# Patient Record
Sex: Male | Born: 1935 | Race: Black or African American | Hispanic: No | Marital: Married | Smoking: Current every day smoker
Health system: Southern US, Community
[De-identification: ages and names within clinical notes are randomized; demographics above are authoritative.]

## PROBLEM LIST (undated history)

## (undated) DIAGNOSIS — F0390 Unspecified dementia without behavioral disturbance: Secondary | ICD-10-CM

## (undated) DIAGNOSIS — D649 Anemia, unspecified: Secondary | ICD-10-CM

## (undated) HISTORY — PX: NO PAST SURGERIES: SHX2092

---

## 2007-11-05 ENCOUNTER — Emergency Department (HOSPITAL_COMMUNITY): Admission: EM | Admit: 2007-11-05 | Discharge: 2007-11-05 | Payer: Self-pay | Admitting: Emergency Medicine

## 2015-07-09 ENCOUNTER — Inpatient Hospital Stay (HOSPITAL_COMMUNITY)
Admission: EM | Admit: 2015-07-09 | Discharge: 2015-07-16 | DRG: 064 | Disposition: A | Discharge status: Skilled Nursing Facility | Payer: Medicare Other | Attending: Family Medicine | Admitting: Family Medicine

## 2015-07-09 ENCOUNTER — Inpatient Hospital Stay (HOSPITAL_COMMUNITY): Payer: Medicare Other

## 2015-07-09 ENCOUNTER — Emergency Department (HOSPITAL_COMMUNITY): Payer: Medicare Other

## 2015-07-09 ENCOUNTER — Encounter (HOSPITAL_COMMUNITY): Payer: Self-pay | Admitting: *Deleted

## 2015-07-09 DIAGNOSIS — G510 Bell's palsy: Secondary | ICD-10-CM | POA: Diagnosis present

## 2015-07-09 DIAGNOSIS — F0390 Unspecified dementia without behavioral disturbance: Secondary | ICD-10-CM | POA: Diagnosis present

## 2015-07-09 DIAGNOSIS — N179 Acute kidney failure, unspecified: Secondary | ICD-10-CM | POA: Diagnosis present

## 2015-07-09 DIAGNOSIS — I7389 Other specified peripheral vascular diseases: Secondary | ICD-10-CM | POA: Diagnosis present

## 2015-07-09 DIAGNOSIS — T148XXA Other injury of unspecified body region, initial encounter: Secondary | ICD-10-CM

## 2015-07-09 DIAGNOSIS — E11649 Type 2 diabetes mellitus with hypoglycemia without coma: Secondary | ICD-10-CM | POA: Diagnosis present

## 2015-07-09 DIAGNOSIS — R451 Restlessness and agitation: Secondary | ICD-10-CM | POA: Diagnosis present

## 2015-07-09 DIAGNOSIS — S0003XA Contusion of scalp, initial encounter: Secondary | ICD-10-CM | POA: Diagnosis not present

## 2015-07-09 DIAGNOSIS — I808 Phlebitis and thrombophlebitis of other sites: Secondary | ICD-10-CM | POA: Diagnosis not present

## 2015-07-09 DIAGNOSIS — I639 Cerebral infarction, unspecified: Secondary | ICD-10-CM

## 2015-07-09 DIAGNOSIS — D649 Anemia, unspecified: Secondary | ICD-10-CM | POA: Diagnosis present

## 2015-07-09 DIAGNOSIS — R471 Dysarthria and anarthria: Secondary | ICD-10-CM | POA: Diagnosis present

## 2015-07-09 DIAGNOSIS — R7989 Other specified abnormal findings of blood chemistry: Secondary | ICD-10-CM

## 2015-07-09 DIAGNOSIS — Z720 Tobacco use: Secondary | ICD-10-CM | POA: Diagnosis present

## 2015-07-09 DIAGNOSIS — F0391 Unspecified dementia with behavioral disturbance: Secondary | ICD-10-CM | POA: Diagnosis not present

## 2015-07-09 DIAGNOSIS — N189 Chronic kidney disease, unspecified: Secondary | ICD-10-CM | POA: Diagnosis present

## 2015-07-09 DIAGNOSIS — Z66 Do not resuscitate: Secondary | ICD-10-CM | POA: Diagnosis present

## 2015-07-09 DIAGNOSIS — Z681 Body mass index (BMI) 19 or less, adult: Secondary | ICD-10-CM

## 2015-07-09 DIAGNOSIS — Z23 Encounter for immunization: Secondary | ICD-10-CM

## 2015-07-09 DIAGNOSIS — E43 Unspecified severe protein-calorie malnutrition: Secondary | ICD-10-CM | POA: Insufficient documentation

## 2015-07-09 DIAGNOSIS — W1830XA Fall on same level, unspecified, initial encounter: Secondary | ICD-10-CM | POA: Diagnosis not present

## 2015-07-09 DIAGNOSIS — E162 Hypoglycemia, unspecified: Secondary | ICD-10-CM | POA: Diagnosis not present

## 2015-07-09 DIAGNOSIS — Z515 Encounter for palliative care: Secondary | ICD-10-CM | POA: Diagnosis not present

## 2015-07-09 DIAGNOSIS — E860 Dehydration: Secondary | ICD-10-CM | POA: Diagnosis present

## 2015-07-09 DIAGNOSIS — R64 Cachexia: Secondary | ICD-10-CM | POA: Diagnosis present

## 2015-07-09 DIAGNOSIS — D62 Acute posthemorrhagic anemia: Secondary | ICD-10-CM | POA: Diagnosis not present

## 2015-07-09 DIAGNOSIS — G92 Toxic encephalopathy: Secondary | ICD-10-CM | POA: Diagnosis present

## 2015-07-09 DIAGNOSIS — E785 Hyperlipidemia, unspecified: Secondary | ICD-10-CM | POA: Diagnosis present

## 2015-07-09 DIAGNOSIS — E871 Hypo-osmolality and hyponatremia: Secondary | ICD-10-CM | POA: Diagnosis not present

## 2015-07-09 DIAGNOSIS — I129 Hypertensive chronic kidney disease with stage 1 through stage 4 chronic kidney disease, or unspecified chronic kidney disease: Secondary | ICD-10-CM | POA: Diagnosis present

## 2015-07-09 DIAGNOSIS — I6789 Other cerebrovascular disease: Secondary | ICD-10-CM | POA: Diagnosis not present

## 2015-07-09 DIAGNOSIS — H510 Palsy (spasm) of conjugate gaze: Secondary | ICD-10-CM

## 2015-07-09 DIAGNOSIS — I6330 Cerebral infarction due to thrombosis of unspecified cerebral artery: Principal | ICD-10-CM | POA: Insufficient documentation

## 2015-07-09 DIAGNOSIS — R0902 Hypoxemia: Secondary | ICD-10-CM | POA: Diagnosis not present

## 2015-07-09 DIAGNOSIS — F17210 Nicotine dependence, cigarettes, uncomplicated: Secondary | ICD-10-CM | POA: Diagnosis present

## 2015-07-09 DIAGNOSIS — R410 Disorientation, unspecified: Secondary | ICD-10-CM | POA: Diagnosis not present

## 2015-07-09 DIAGNOSIS — G9340 Encephalopathy, unspecified: Secondary | ICD-10-CM | POA: Diagnosis present

## 2015-07-09 DIAGNOSIS — E1122 Type 2 diabetes mellitus with diabetic chronic kidney disease: Secondary | ICD-10-CM | POA: Diagnosis present

## 2015-07-09 DIAGNOSIS — N390 Urinary tract infection, site not specified: Secondary | ICD-10-CM | POA: Diagnosis present

## 2015-07-09 HISTORY — DX: Unspecified dementia, unspecified severity, without behavioral disturbance, psychotic disturbance, mood disturbance, and anxiety: F03.90

## 2015-07-09 HISTORY — DX: Anemia, unspecified: D64.9

## 2015-07-09 LAB — COMPREHENSIVE METABOLIC PANEL
ALT: 17 U/L (ref 17–63)
AST: 24 U/L (ref 15–41)
Albumin: 3.5 g/dL (ref 3.5–5.0)
Alkaline Phosphatase: 59 U/L (ref 38–126)
Anion gap: 10 (ref 5–15)
BILIRUBIN TOTAL: 0.8 mg/dL (ref 0.3–1.2)
BUN: 14 mg/dL (ref 6–20)
CHLORIDE: 110 mmol/L (ref 101–111)
CO2: 22 mmol/L (ref 22–32)
CREATININE: 1.71 mg/dL — AB (ref 0.61–1.24)
Calcium: 8.8 mg/dL — ABNORMAL LOW (ref 8.9–10.3)
GFR, EST AFRICAN AMERICAN: 39 mL/min — AB (ref >60)
GFR, EST NON AFRICAN AMERICAN: 34 mL/min — AB (ref >60)
Glucose, Bld: 95 mg/dL (ref 65–99)
Potassium: 4.2 mmol/L (ref 3.5–5.1)
Sodium: 142 mmol/L (ref 135–145)
TOTAL PROTEIN: 7 g/dL (ref 6.5–8.1)

## 2015-07-09 LAB — URINALYSIS, ROUTINE W REFLEX MICROSCOPIC
BILIRUBIN URINE: NEGATIVE
GLUCOSE, UA: NEGATIVE mg/dL
HGB URINE DIPSTICK: NEGATIVE
Ketones, ur: NEGATIVE mg/dL
Nitrite: POSITIVE — AB
PROTEIN: NEGATIVE mg/dL
SPECIFIC GRAVITY, URINE: 1.012 (ref 1.005–1.030)
pH: 5.5 (ref 5.0–8.0)

## 2015-07-09 LAB — FOLATE: FOLATE: 14.2 ng/mL (ref >5.9)

## 2015-07-09 LAB — IRON AND TIBC
IRON: 20 ug/dL — AB (ref 45–182)
Saturation Ratios: 8 % — ABNORMAL LOW (ref 17.9–39.5)
TIBC: 245 ug/dL — AB (ref 250–450)
UIBC: 225 ug/dL

## 2015-07-09 LAB — CBC
HCT: 33.6 % — ABNORMAL LOW (ref 39.0–52.0)
Hemoglobin: 10.9 g/dL — ABNORMAL LOW (ref 13.0–17.0)
MCH: 28.3 pg (ref 26.0–34.0)
MCHC: 32.4 g/dL (ref 30.0–36.0)
MCV: 87.3 fL (ref 78.0–100.0)
PLATELETS: 167 10*3/uL (ref 150–400)
RBC: 3.85 MIL/uL — ABNORMAL LOW (ref 4.22–5.81)
RDW: 14 % (ref 11.5–15.5)
WBC: 9.3 10*3/uL (ref 4.0–10.5)

## 2015-07-09 LAB — URINE MICROSCOPIC-ADD ON: RBC / HPF: NONE SEEN RBC/hpf (ref 0–5)

## 2015-07-09 LAB — TSH: TSH: 1.952 u[IU]/mL (ref 0.350–4.500)

## 2015-07-09 LAB — RETICULOCYTES
RBC.: 3.87 MIL/uL — ABNORMAL LOW (ref 4.22–5.81)
Retic Count, Absolute: 50.3 10*3/uL (ref 19.0–186.0)
Retic Ct Pct: 1.3 % (ref 0.4–3.1)

## 2015-07-09 LAB — CBG MONITORING, ED: GLUCOSE-CAPILLARY: 75 mg/dL (ref 65–99)

## 2015-07-09 LAB — I-STAT CG4 LACTIC ACID, ED
LACTIC ACID, VENOUS: 1.89 mmol/L (ref 0.5–2.0)
Lactic Acid, Venous: 1.48 mmol/L (ref 0.5–2.0)

## 2015-07-09 LAB — FERRITIN: FERRITIN: 503 ng/mL — AB (ref 24–336)

## 2015-07-09 LAB — VITAMIN B12: Vitamin B-12: 398 pg/mL (ref 180–914)

## 2015-07-09 MED ORDER — ACETAMINOPHEN 650 MG RE SUPP: 650.0000 mg | Freq: Four times a day (QID) | RECTAL | Status: DC | PRN

## 2015-07-09 MED ORDER — SODIUM CHLORIDE 0.9 % IV SOLN
Freq: Once | INTRAVENOUS | Status: AC
  Administered 2015-07-09: 125 mL/h via INTRAVENOUS

## 2015-07-09 MED ORDER — DEXTROSE 5 % IV SOLN
1.0000 g | INTRAVENOUS | Status: DC
  Administered 2015-07-10 – 2015-07-12 (×3): 1 g via INTRAVENOUS
  Filled 2015-07-09 (×4): qty 10

## 2015-07-09 MED ORDER — SODIUM CHLORIDE 0.9 % IV SOLN: INTRAVENOUS | Status: DC

## 2015-07-09 MED ORDER — ALBUTEROL SULFATE (2.5 MG/3ML) 0.083% IN NEBU: 2.5000 mg | INHALATION_SOLUTION | RESPIRATORY_TRACT | Status: DC | PRN

## 2015-07-09 MED ORDER — ADULT MULTIVITAMIN W/MINERALS CH
1.0000 | ORAL_TABLET | Freq: Every day | ORAL | Status: DC
  Administered 2015-07-10 – 2015-07-16 (×7): 1 via ORAL
  Filled 2015-07-09 (×8): qty 1

## 2015-07-09 MED ORDER — ENSURE ENLIVE PO LIQD
237.0000 mL | Freq: Two times a day (BID) | ORAL | Status: DC
  Administered 2015-07-10 – 2015-07-16 (×7): 237 mL via ORAL

## 2015-07-09 MED ORDER — DEXTROSE 5 % IV SOLN
1.0000 g | Freq: Once | INTRAVENOUS | Status: AC
  Administered 2015-07-09: 1 g via INTRAVENOUS
  Filled 2015-07-09: qty 10

## 2015-07-09 MED ORDER — SODIUM CHLORIDE 0.9 % IV SOLN
INTRAVENOUS | Status: DC
  Administered 2015-07-09: 22:00:00 via INTRAVENOUS

## 2015-07-09 MED ORDER — ENOXAPARIN SODIUM 30 MG/0.3ML ~~LOC~~ SOLN
30.0000 mg | SUBCUTANEOUS | Status: DC
  Administered 2015-07-09 – 2015-07-14 (×6): 30 mg via SUBCUTANEOUS
  Filled 2015-07-09 (×7): qty 0.3

## 2015-07-09 MED ORDER — ACETAMINOPHEN 325 MG PO TABS
650.0000 mg | ORAL_TABLET | Freq: Four times a day (QID) | ORAL | Status: DC | PRN
  Administered 2015-07-14: 650 mg via ORAL
  Filled 2015-07-09: qty 2

## 2015-07-09 MED ORDER — PNEUMOCOCCAL VAC POLYVALENT 25 MCG/0.5ML IJ INJ
0.5000 mL | INJECTION | INTRAMUSCULAR | Status: AC
  Administered 2015-07-10: 0.5 mL via INTRAMUSCULAR
  Filled 2015-07-09: qty 0.5

## 2015-07-09 MED ORDER — HALOPERIDOL LACTATE 5 MG/ML IJ SOLN
5.0000 mg | Freq: Once | INTRAMUSCULAR | Status: AC
  Administered 2015-07-09: 5 mg via INTRAVENOUS
  Filled 2015-07-09: qty 1

## 2015-07-09 MED ORDER — NICOTINE 14 MG/24HR TD PT24
14.0000 mg | MEDICATED_PATCH | Freq: Every day | TRANSDERMAL | Status: DC
  Administered 2015-07-09 – 2015-07-15 (×7): 14 mg via TRANSDERMAL
  Filled 2015-07-09 (×7): qty 1

## 2015-07-09 MED ORDER — TETANUS-DIPHTH-ACELL PERTUSSIS 5-2.5-18.5 LF-MCG/0.5 IM SUSP
0.5000 mL | Freq: Once | INTRAMUSCULAR | Status: AC
  Administered 2015-07-09: 0.5 mL via INTRAMUSCULAR
  Filled 2015-07-09: qty 0.5

## 2015-07-09 NOTE — Progress Notes (Signed)
Received report from [name], RN in ED 

## 2015-07-09 NOTE — ED Notes (Signed)
Pt here via [facility] for altered behavior/weakness.  Pt normally wakes up at 10 am.  At 1245 wife noted that pt was not up and about.   Went to pt room and found him still laying in bed.  She noticed a skin tear to R arm.   CBG 63 initially per [facility] that increased to 114 after apple juice.  Sats 97% RA upon arrival.  Pt altered per norm per family.  No known med hx d/t fact that pt has not seen MD since he was discharged [facility].

## 2015-07-09 NOTE — H&P (Signed)
History and Physical    Roy Cole FQH:[id number] DOB: [birth year] DOA: 07/09/2015  Referring Provider: Dr. Tammy Sours, [place] PCP: No primary care provider on file.  Outpatient Specialists:  None  Patient coming from: Home  Chief Complaint: Confusion  HPI: Roy Cole is a 82 y.o. male, married, lives with spouse, independent of activities of daily living, has not seen a physician in decades, PMH of anemia, reported dementia, tobacco abuse, presented to [facility] ED on 07/09/15 with complaints of confusion. Patient unable to provide any history. Patient's spouse and 2 sons at bedside provided history. At baseline, patient apparently has memory deficits and intermittent confusion, gradually declining over the last 1 year, independent of activities of daily living and ambulates at home without help of assistance. He was in his usual state of health when he went to bed last night. He did not wake up at his usual time which is about 10 AM. His wife then went to look for him at approximately 1pm when she found him laying in bed with some new skin tears over right forearm. A lamp had been knocked down indicating that he might have fallen. Patient appeared confused and complained of some low back pain but nothing else. EMS found CBG of 63 which increased to 114 after apple juice. No reported fever, chills, headache, earache, sore throat, dyspnea, chest pain, nausea, vomiting, diarrhea. Apparently does not drink enough liquids. Chronic intermittent smoker's cough. Urinary frequency but no dysuria reported.  ED Course: Workup revealed hemoglobin 10.9, creatinine 1.7, normal LFTs, glucose 75, urine microscopy suggestive of UTI, chest x-ray without acute findings, CT head without acute findings. CT scan cervical spine and x-ray of lumbar spine suggestive of degenerative disease. Hydrated with IV fluids and received a dose of IV Rocephin. Disconjugate gaze noted by EDP-then family indicated that this is new. No  strokelike symptoms. Hospitalist admission was requested.  Review of Systems:  All other systems reviewed and apart from HPI, are negative.  Past Medical History  Diagnosis Date  . Anemia   . Dementia     Past Surgical History  Procedure Laterality Date  . No past surgeries     Social history  reports that he has been smoking Cigarettes.  He has been smoking about 0.50 packs per day. He does not have any smokeless tobacco history on file. He reports that he does not drink alcohol or use illicit drugs.  No Known Allergies  Family History  Problem Relation Age of Onset  . Family history unknown: Yes   As per family, no significant family history. Patient unable to provide any history by himself.  Prior to Admission medications   Not on File    Physical Exam: Filed Vitals:   07/09/15 1440 07/09/15 1445 07/09/15 1515 07/09/15 1904  BP:  156/90 168/95 142/103  Temp: 99.1 F (37.3 C)     TempSrc: Oral     Resp:   28   Height: _0  (1.727 m)     Weight: 54.432 kg (120 lb)         Constitutional: Elderly male, moderately built, frail and cachectic, chronically ill-looking, restless and trying to get up from the gurney. Patient was examined with ED RN in room. Family at bedside. Eyes: PERTLA, lids and conjunctivae normal. Disconjugate gaze. Bilateral cataracts and arcus senilis. ENMT: Mucous membranes are dry. Posterior pharynx clear of any exudate or lesions. Edentulous..  Neck: normal, supple, no masses, no thyromegaly Respiratory: clear to auscultation bilaterally/poor inspiratory effort, no  wheezing, no crackles. No increased work of breathing. No accessory muscle use.  Cardiovascular: S1 & S2 heard, regular rate and rhythm, no murmurs / rubs / gallops. No extremity edema. 2+ pedal pulses. No carotid bruits.  Abdomen: No distension, no tenderness, no masses palpated. No hepatosplenomegaly. Bowel sounds normal.  Musculoskeletal: no clubbing / cyanosis. No joint deformity  upper and lower extremities. Good ROM, no contractures. Normal muscle tone.  Skin: Superficial skin tears over right scapula, right hip and a larger skin tear over right mid forearm. No other acute findings noted. Neurologic: Alert but not oriented and does not follow instructions. Disconjugate eye movements. Right eye with lateral preferential gaze and does not seem to cross midline. Sensation seem intact, DTR normal. Strength 5/5 in all 4 limbs.  Psychiatric: Unable to assess secondary to confusion.     Labs on Admission: I have personally reviewed following labs and imaging studies  CBC:  Recent Labs Lab 07/09/15 1523  WBC 9.3  HGB 10.9*  HCT 33.6*  MCV 87.3  PLT 720   Basic Metabolic Panel:  Recent Labs Lab 07/09/15 1523  NA 142  K 4.2  CL 110  CO2 22  GLUCOSE 95  BUN 14  CREATININE 1.71*  CALCIUM 8.8*   GFR: Estimated Creatinine Clearance: 22.5 mL/min (by C-G formula based on Cr of 1.71). Liver Function Tests:  Recent Labs Lab 07/09/15 1523  AST 24  ALT 17  ALKPHOS 59  BILITOT 0.8  PROT 7.0  ALBUMIN 3.5   No results for input(s): LIPASE, AMYLASE in the last 168 hours. No results for input(s): AMMONIA in the last 168 hours. Coagulation Profile: No results for input(s): INR, PROTIME in the last 168 hours. Cardiac Enzymes: No results for input(s): CKTOTAL, CKMB, CKMBINDEX, TROPONINI in the last 168 hours. BNP (last 3 results) No results for input(s): PROBNP in the last 8760 hours. HbA1C: No results for input(s): HGBA1C in the last 72 hours. CBG:  Recent Labs Lab 07/09/15 1510  GLUCAP 75   Lipid Profile: No results for input(s): CHOL, HDL, LDLCALC, TRIG, CHOLHDL, LDLDIRECT in the last 72 hours. Thyroid Function Tests: No results for input(s): TSH, T4TOTAL, FREET4, T3FREE, THYROIDAB in the last 72 hours. Anemia Panel: No results for input(s): VITAMINB12, FOLATE, FERRITIN, TIBC, IRON, RETICCTPCT in the last 72 hours. Urine analysis:    Component  Value Date/Time   COLORURINE YELLOW 07/09/2015 1527   APPEARANCEUR CLOUDY* 07/09/2015 1527   LABSPEC 1.012 07/09/2015 1527   PHURINE 5.5 07/09/2015 1527   GLUCOSEU NEGATIVE 07/09/2015 1527   HGBUR NEGATIVE 07/09/2015 [place] 07/09/2015 1527   [place] 07/09/2015 1527   PROTEINUR NEGATIVE 07/09/2015 1527   NITRITE POSITIVE* 07/09/2015 1527   LEUKOCYTESUR SMALL* 07/09/2015 1527     Radiological Exams on Admission: Dg Chest 2 View  07/09/2015  CLINICAL DATA:  Weakness and altered behavior.  Questionable fall EXAM: CHEST  2 VIEW COMPARISON:  None. FINDINGS: There is scarring in the left base. There is no edema or consolidation. Heart size and pulmonary vascularity are normal. No adenopathy. No pneumothorax. No acute fracture evident. IMPRESSION: Scarring left base. No edema or consolidation. No apparent pneumothorax. Electronically Signed   By: Lowella Grip III M.D.   On: 07/09/2015 15:56   Dg Lumbar Spine Complete  07/09/2015  CLINICAL DATA:  Pain following fall EXAM: LUMBAR SPINE - COMPLETE 4+ VIEW COMPARISON:  None. FINDINGS: Frontal, lateral, spot lumbosacral lateral, and bilateral oblique views were obtained. There are 5  non-rib-bearing lumbar type vertebral bodies. There is lumbar dextroscoliosis with rotatory component. There is anterior wedging of the L1 and L2 vertebral bodies, age uncertain. No other fractures are identified. There is no spondylolisthesis. There is moderate disc space narrowing at L2-3 and L3-4. There is facet osteoarthritic change at L3-4, L4-5, and L5-S1 bilaterally. IMPRESSION: Age uncertain anterior wedging of the L1 and L2 vertebral bodies. Scoliosis with osteoarthritic change at several levels. No spondylolisthesis. Electronically Signed   By: Lowella Grip III M.D.   On: 07/09/2015 15:58   Ct Head Wo Contrast  07/09/2015  CLINICAL DATA:  Altered mental status with fall EXAM: CT HEAD WITHOUT CONTRAST CT CERVICAL SPINE WITHOUT  CONTRAST TECHNIQUE: Multidetector CT imaging of the head and cervical spine was performed following the standard protocol without intravenous contrast. Multiplanar CT image reconstructions of the cervical spine were also generated. COMPARISON:  None. FINDINGS: CT HEAD FINDINGS There is mild generalized atrophy. There is no intracranial mass, hemorrhage, extra-axial fluid collection, or midline shift. There is patchy small vessel disease throughout the centra semiovale bilaterally. Elsewhere gray-white compartments appear normal. No acute infarct is evident. The bony calvarium appears intact. The mastoid air cells are clear. No intraorbital lesions are evident. Paranasal sinuses are clear. CT CERVICAL SPINE FINDINGS There is no acute fracture. There is 4 mm of retrolisthesis of C5 on C6. There is minimal retrolisthesis of C6 on C7. There is no other spondylolisthesis. Prevertebral soft tissues and predental space regions are normal. There is marked bony overgrowth along the anterior arch of C2 on the left, consistent with advanced osteoarthritis and possibly representative of residua from prior trauma. No acute lesion is seen in this area. There is moderate facet hypertrophy at multiple levels bilaterally. There is exit foraminal narrowing due to bony hypertrophy at C3-4 on the right, at C4-5 bilaterally, at C5-6 bilaterally, and at C6-7 on the right due to bony hypertrophy. There is bullous disease in the lung apices bilaterally. There are foci of carotid artery calcification bilaterally. IMPRESSION: CT head: Atrophy with periventricular small vessel disease. No intracranial mass, hemorrhage, or extra-axial fluid collection. No acute infarct evident. CT cervical spine: No acute fracture. Areas of spondylolisthesis at C5-6 and C6-7 are felt to be due to underlying spondylolisthesis. Question old trauma anterior aspect of C2 on the left. Multifocal arthropathy with exit foraminal narrowing at multiple levels. Foci of  carotid artery calcification bilaterally noted. Bullous disease noted in each upper lobe/apex region. Electronically Signed   By: Lowella Grip III M.D.   On: 07/09/2015 16:53   Ct Cervical Spine Wo Contrast  07/09/2015  CLINICAL DATA:  Altered mental status with fall EXAM: CT HEAD WITHOUT CONTRAST CT CERVICAL SPINE WITHOUT CONTRAST TECHNIQUE: Multidetector CT imaging of the head and cervical spine was performed following the standard protocol without intravenous contrast. Multiplanar CT image reconstructions of the cervical spine were also generated. COMPARISON:  None. FINDINGS: CT HEAD FINDINGS There is mild generalized atrophy. There is no intracranial mass, hemorrhage, extra-axial fluid collection, or midline shift. There is patchy small vessel disease throughout the centra semiovale bilaterally. Elsewhere gray-white compartments appear normal. No acute infarct is evident. The bony calvarium appears intact. The mastoid air cells are clear. No intraorbital lesions are evident. Paranasal sinuses are clear. CT CERVICAL SPINE FINDINGS There is no acute fracture. There is 4 mm of retrolisthesis of C5 on C6. There is minimal retrolisthesis of C6 on C7. There is no other spondylolisthesis. Prevertebral soft tissues and predental space regions are  normal. There is marked bony overgrowth along the anterior arch of C2 on the left, consistent with advanced osteoarthritis and possibly representative of residua from prior trauma. No acute lesion is seen in this area. There is moderate facet hypertrophy at multiple levels bilaterally. There is exit foraminal narrowing due to bony hypertrophy at C3-4 on the right, at C4-5 bilaterally, at C5-6 bilaterally, and at C6-7 on the right due to bony hypertrophy. There is bullous disease in the lung apices bilaterally. There are foci of carotid artery calcification bilaterally. IMPRESSION: CT head: Atrophy with periventricular small vessel disease. No intracranial mass,  hemorrhage, or extra-axial fluid collection. No acute infarct evident. CT cervical spine: No acute fracture. Areas of spondylolisthesis at C5-6 and C6-7 are felt to be due to underlying spondylolisthesis. Question old trauma anterior aspect of C2 on the left. Multifocal arthropathy with exit foraminal narrowing at multiple levels. Foci of carotid artery calcification bilaterally noted. Bullous disease noted in each upper lobe/apex region. Electronically Signed   By: Lowella Grip III M.D.   On: 07/09/2015 16:53    EKG: Independently reviewed. Sinus rhythm without acute findings. QTC 428 ms.  Assessment/Plan Principal Problem:   UTI (lower urinary tract infection) Active Problems:   Acute encephalopathy   Dehydration   Renal failure, acute on chronic (HCC)   Anemia   Dementia   Tobacco abuse   Hypoglycemia     UTI - Continue IV Rocephin pending urine culture results.  Dehydration - IV fluids  Acute encephalopathy - Secondary to acute medical illness (UTI, dehydration, acute kidney injury, R/O stroke) complicating underlying dementia - Treat underlying cause and monitor.   Acute on chronic kidney disease - Baseline creatinine not known. Presented with creatinine of 1.7. - Hydrated with IV fluids and follow BMP. - Check bladder scan to rule out urinary retention. - Check renal ultrasound to rule out hydronephrosis.  Anemia, chronic - Chek anemia panel. Follow CBCs.  Dementia with behavioral disturbance - As per spouse, felt to have dementia for the last year and gradually declining. - Mental status worse than baseline. Normally he is calm. - Check B12, TSH  Disconjugate gaze - Neurology consulted and recommend MRI brain with and without contrast. Left partial third no palsy versus a medial rectus palsy on the left versus partial INO from stroke, per neurology evaluation. However given renal insufficiency, discussed with nephrologist and will obtain MRI of brain without  contrast only. Check ESR and TSH.  Hypoglycemia - ? Secondary to poor oral intake. Monitor CBGs and treat as needed.  Tobacco abuse - Nicotine patch  Possible fall at home with skin tears - PT and OT evaluation.   DVT prophylaxis: Lovenox  Code Status: Full  Family Communication: Discussed with patient's spouse and 2 sons at bedside.  Disposition Plan: DC home when medically stable.  Consults called: Neurology.  Admission status: Inpatient, medical bed.    [facility] MD [facility] Pager [phone]  If 7PM-7AM, please contact night-coverage [email or web address] Password [email or web address]  07/09/2015, 7:18 PM

## 2015-07-09 NOTE — Consult Note (Addendum)
NEURO HOSPITALIST CONSULT NOTE      Reason for Consult:disconjugate gaze   History obtained from:  Family at bedside  HPI:                                                                                                                                          Roy Cole is an 82 yo male with hx of dementia who presents today post fall at home, found to have a UTI is presenting with disconjugate gaze which per family appears to be new. Has severe dementia at baseline but more altered here due to UTI.  Past Medical History  Diagnosis Date  . Anemia   . Dementia     Past Surgical History  Procedure Laterality Date  . No past surgeries      Family History  Problem Relation Age of Onset  . Family history unknown: Yes      Social History:  reports that he has been smoking Cigarettes.  He has been smoking about 0.50 packs per day. He does not have any smokeless tobacco history on file. He reports that he does not drink alcohol or use illicit drugs.  No Known Allergies  MEDICATIONS:                                                                                                                     I have reviewed the patient's current medications.   ROS:                                                                                                                                       History obtained from chart review  General ROS: negative for -  chills, fatigue, fever, night sweats, weight gain or weight loss Psychological ROS: negative for - behavioral disorder, hallucinations, memory difficulties, mood swings or suicidal ideation Ophthalmic ROS: negative for - blurry vision, double vision, eye pain or loss of vision ENT ROS: negative for - epistaxis, nasal discharge, oral lesions, sore throat, tinnitus or vertigo Allergy and Immunology ROS: negative for - hives or itchy/watery eyes Hematological and Lymphatic ROS: negative for - bleeding problems,  bruising or swollen lymph nodes Endocrine ROS: negative for - galactorrhea, hair pattern changes, polydipsia/polyuria or temperature intolerance Respiratory ROS: negative for - cough, hemoptysis, shortness of breath or wheezing Cardiovascular ROS: negative for - chest pain, dyspnea on exertion, edema or irregular heartbeat Gastrointestinal ROS: negative for - abdominal pain, diarrhea, hematemesis, nausea/vomiting or stool incontinence Genito-Urinary ROS: negative for - dysuria, hematuria, incontinence or urinary frequency/urgency Musculoskeletal ROS: negative for - joint swelling or muscular weakness Neurological ROS: as noted in HPI Dermatological ROS: negative for rash and skin lesion changes   Blood pressure 168/95, temperature 99.1 F (37.3 C), temperature source Oral, resp. rate 28, height 5' 8"  (1.727 m), weight 54.432 kg (120 lb).   Neurologic Examination:                                                                                                      HEENT-  Normocephalic, no lesions, without obvious abnormality.  Normal external eye and conjunctiva.  Normal TM's bilaterally.  Normal auditory canals and external ears. Normal external nose, mucus membranes and septum.  Normal pharynx. Cardiovascular- regular rate and rhythm, S1, S2 normal, no murmur, click, rub or gallop, pulses palpable throughout   Lungs- chest clear, no wheezing, rales, normal symmetric air entry, Heart exam - S1, S2 normal, no murmur, no gallop, rate regular Abdomen- soft, non-tender; bowel sounds normal; no masses,  no organomegaly   Neurological Examination Mental Status: Alert, Oriented x to self Follows commands Moves all extremities, 5/5 Pupils are small and equal 45m and reactive R eye has good EOMI L eye appears he cant adduct it but is able to look up and down. Appears as a partial 3rd nerve palsy. Denies any pain No sensory deficits noted      Lab Results: Basic Metabolic Panel:  Recent  Labs Lab 07/09/15 1523  NA 142  K 4.2  CL 110  CO2 22  GLUCOSE 95  BUN 14  CREATININE 1.71*  CALCIUM 8.8*    Liver Function Tests:  Recent Labs Lab 07/09/15 1523  AST 24  ALT 17  ALKPHOS 59  BILITOT 0.8  PROT 7.0  ALBUMIN 3.5   No results for input(s): LIPASE, AMYLASE in the last 168 hours. No results for input(s): AMMONIA in the last 168 hours.  CBC:  Recent Labs Lab 07/09/15 1523  WBC 9.3  HGB 10.9*  HCT 33.6*  MCV 87.3  PLT 167    Cardiac Enzymes: No results for input(s): CKTOTAL, CKMB, CKMBINDEX, TROPONINI in the last 168 hours.  Lipid Panel: No results for input(s): CHOL, TRIG, HDL, CHOLHDL, VLDL, LDLCALC in the last 168 hours.  CBG:  Recent Labs Lab 07/09/15 [place]    Microbiology: No results found for this or any previous visit.  Coagulation Studies: No results for input(s): LABPROT, INR in the last 72 hours.  Imaging: Dg Chest 2 View  07/09/2015  CLINICAL DATA:  Weakness and altered behavior.  Questionable fall EXAM: CHEST  2 VIEW COMPARISON:  None. FINDINGS: There is scarring in the left base. There is no edema or consolidation. Heart size and pulmonary vascularity are normal. No adenopathy. No pneumothorax. No acute fracture evident. IMPRESSION: Scarring left base. No edema or consolidation. No apparent pneumothorax. Electronically Signed   By: Lowella Grip III M.D.   On: 07/09/2015 15:56   Dg Lumbar Spine Complete  07/09/2015  CLINICAL DATA:  Pain following fall EXAM: LUMBAR SPINE - COMPLETE 4+ VIEW COMPARISON:  None. FINDINGS: Frontal, lateral, spot lumbosacral lateral, and bilateral oblique views were obtained. There are 5 non-rib-bearing lumbar type vertebral bodies. There is lumbar dextroscoliosis with rotatory component. There is anterior wedging of the L1 and L2 vertebral bodies, age uncertain. No other fractures are identified. There is no spondylolisthesis. There is moderate disc space narrowing at L2-3 and L3-4. There is  facet osteoarthritic change at L3-4, L4-5, and L5-S1 bilaterally. IMPRESSION: Age uncertain anterior wedging of the L1 and L2 vertebral bodies. Scoliosis with osteoarthritic change at several levels. No spondylolisthesis. Electronically Signed   By: Lowella Grip III M.D.   On: 07/09/2015 15:58   Ct Head Wo Contrast  07/09/2015  CLINICAL DATA:  Altered mental status with fall EXAM: CT HEAD WITHOUT CONTRAST CT CERVICAL SPINE WITHOUT CONTRAST TECHNIQUE: Multidetector CT imaging of the head and cervical spine was performed following the standard protocol without intravenous contrast. Multiplanar CT image reconstructions of the cervical spine were also generated. COMPARISON:  None. FINDINGS: CT HEAD FINDINGS There is mild generalized atrophy. There is no intracranial mass, hemorrhage, extra-axial fluid collection, or midline shift. There is patchy small vessel disease throughout the centra semiovale bilaterally. Elsewhere gray-white compartments appear normal. No acute infarct is evident. The bony calvarium appears intact. The mastoid air cells are clear. No intraorbital lesions are evident. Paranasal sinuses are clear. CT CERVICAL SPINE FINDINGS There is no acute fracture. There is 4 mm of retrolisthesis of C5 on C6. There is minimal retrolisthesis of C6 on C7. There is no other spondylolisthesis. Prevertebral soft tissues and predental space regions are normal. There is marked bony overgrowth along the anterior arch of C2 on the left, consistent with advanced osteoarthritis and possibly representative of residua from prior trauma. No acute lesion is seen in this area. There is moderate facet hypertrophy at multiple levels bilaterally. There is exit foraminal narrowing due to bony hypertrophy at C3-4 on the right, at C4-5 bilaterally, at C5-6 bilaterally, and at C6-7 on the right due to bony hypertrophy. There is bullous disease in the lung apices bilaterally. There are foci of carotid artery calcification  bilaterally. IMPRESSION: CT head: Atrophy with periventricular small vessel disease. No intracranial mass, hemorrhage, or extra-axial fluid collection. No acute infarct evident. CT cervical spine: No acute fracture. Areas of spondylolisthesis at C5-6 and C6-7 are felt to be due to underlying spondylolisthesis. Question old trauma anterior aspect of C2 on the left. Multifocal arthropathy with exit foraminal narrowing at multiple levels. Foci of carotid artery calcification bilaterally noted. Bullous disease noted in each upper lobe/apex region. Electronically Signed   By: Lowella Grip III M.D.   On: 07/09/2015 16:53   Ct Cervical  Spine Wo Contrast  07/09/2015  CLINICAL DATA:  Altered mental status with fall EXAM: CT HEAD WITHOUT CONTRAST CT CERVICAL SPINE WITHOUT CONTRAST TECHNIQUE: Multidetector CT imaging of the head and cervical spine was performed following the standard protocol without intravenous contrast. Multiplanar CT image reconstructions of the cervical spine were also generated. COMPARISON:  None. FINDINGS: CT HEAD FINDINGS There is mild generalized atrophy. There is no intracranial mass, hemorrhage, extra-axial fluid collection, or midline shift. There is patchy small vessel disease throughout the centra semiovale bilaterally. Elsewhere gray-white compartments appear normal. No acute infarct is evident. The bony calvarium appears intact. The mastoid air cells are clear. No intraorbital lesions are evident. Paranasal sinuses are clear. CT CERVICAL SPINE FINDINGS There is no acute fracture. There is 4 mm of retrolisthesis of C5 on C6. There is minimal retrolisthesis of C6 on C7. There is no other spondylolisthesis. Prevertebral soft tissues and predental space regions are normal. There is marked bony overgrowth along the anterior arch of C2 on the left, consistent with advanced osteoarthritis and possibly representative of residua from prior trauma. No acute lesion is seen in this area. There is  moderate facet hypertrophy at multiple levels bilaterally. There is exit foraminal narrowing due to bony hypertrophy at C3-4 on the right, at C4-5 bilaterally, at C5-6 bilaterally, and at C6-7 on the right due to bony hypertrophy. There is bullous disease in the lung apices bilaterally. There are foci of carotid artery calcification bilaterally. IMPRESSION: CT head: Atrophy with periventricular small vessel disease. No intracranial mass, hemorrhage, or extra-axial fluid collection. No acute infarct evident. CT cervical spine: No acute fracture. Areas of spondylolisthesis at C5-6 and C6-7 are felt to be due to underlying spondylolisthesis. Question old trauma anterior aspect of C2 on the left. Multifocal arthropathy with exit foraminal narrowing at multiple levels. Foci of carotid artery calcification bilaterally noted. Bullous disease noted in each upper lobe/apex region. Electronically Signed   By: Lowella Grip III M.D.   On: 07/09/2015 16:53         Assessment/Plan:  82 yo male with hx of dementia who presents today post fall at home, found to have a UTI is presenting with disconjugate gaze which per family appears to be new. Has severe dementia at baseline but more altered here due to UTI.  Disconjugate gaze: On exam only has L partial 3rd nerve palsy vs a medial rectus muscle palsy on the L vs partial INO from stroke. Denies any pain in that eye  - recommend MRI brain with and without contrast -HA1c as DM can cause cranial nerve palsies  - Check ESR as giant cell aritits can potenially cause this - Thyroid panel - Myasthenia gravis is also on the differential but less likely due to his age and no other symptoms

## 2015-07-09 NOTE — ED Provider Notes (Signed)
CSN: [id number]     Arrival date & time 07/09/15  1423 History   First MD Initiated Contact with Patient 07/09/15 1505     Chief Complaint  Patient presents with  . Weakness     HPI Roy Cole is a 82 y.o. M wo sPMH who pw for possible altered mental state, which was described as him sleeping in today more than normal, awoke at 1PM rather than  1030.  No witnessed fall but family noted a new skin tear to right forearm.  Otherwise not acting any differently.No neck pain.  Family says he probabyl has dementia, not oriented to place or time at abseline.  +LBP worse with movement.  FSBG  Per EMS than improved to 114 with juice.  No known f/c/n/c/abd pain/ dysuria/polyuria/diarrhea.  No CP or SOB.  Has not yet tried to walk.   Has not seen doctor in about 30-40 years  History reviewed. No pertinent past medical history. History reviewed. No pertinent past surgical history. No family history on file. Social History  Substance Use Topics  . Smoking status: Current Every Day Smoker -- 0.50 packs/day    Types: Cigarettes  . Smokeless tobacco: None  . Alcohol Use: No    Review of Systems  Constitutional: Positive for fatigue. Negative for fever, chills, diaphoresis, activity change and appetite change.  HENT: Negative for congestion.   Eyes: Negative for photophobia, pain, redness and visual disturbance.  Respiratory: Negative for cough, chest tightness and shortness of breath.   Cardiovascular: Negative for chest pain and leg swelling.  Gastrointestinal: Negative for nausea, vomiting, abdominal pain, diarrhea, constipation and abdominal distention.  Endocrine: Negative for polyuria.  Genitourinary: Negative for dysuria and frequency.  Musculoskeletal: Positive for back pain. Negative for myalgias, joint swelling, arthralgias, neck pain and neck stiffness.  Skin: Positive for wound.  Neurological: Positive for weakness (general). Negative for seizures, syncope, speech difficulty,  light-headedness, numbness and headaches.  All other systems reviewed and are negative.     Allergies  Review of patient's allergies indicates no known allergies.  Home Medications   Prior to Admission medications   Not on File   BP 168/95 mmHg  Temp(Src) 99.1 F (37.3 C) (Oral)  Resp 28  Ht  (1.727 m)  Wt 54.432 kg  BMI 18.25 kg/m2 Physical Exam  Constitutional: He appears well-developed and well-nourished. No distress.  Thin elderly  HENT:  Head: Normocephalic and atraumatic.  Nose: Nose normal.  Eyes: Conjunctivae are normal.  Left eye does not cross midline on rightward gaze. Pupils 2mm equal and sluggish. No conj pallor  Neck: Normal range of motion. Neck supple. No tracheal deviation present.  Cardiovascular: Normal rate, regular rhythm and normal heart sounds.   No murmur heard. Pulmonary/Chest: Effort normal and breath sounds normal. No respiratory distress. He has no rales.  Abdominal: Soft. Bowel sounds are normal. He exhibits no distension and no mass. There is no tenderness.  Genitourinary:  Large, firm, nontender, nonboggy prostate  Musculoskeletal: Normal range of motion. He exhibits no edema.  Neurological: He is alert.  Oriented to person not place or time.  Skin: Skin is warm and dry. No rash noted.  Psychiatric: He has a normal mood and affect.  Nursing note and vitals reviewed.   ED Course  Procedures (including critical care time) Labs Review Labs Reviewed  CBC - Abnormal; Notable for the following:    RBC 3.85 (*)    Hemoglobin 10.9 (*)    HCT 33.6 (*)  All other components within normal limits  URINALYSIS, ROUTINE W REFLEX MICROSCOPIC (NOT [facility]) - Abnormal; Notable for the following:    APPearance CLOUDY (*)    Nitrite POSITIVE (*)    Leukocytes, UA SMALL (*)    All other components within normal limits  COMPREHENSIVE METABOLIC PANEL - Abnormal; Notable for the following:    Creatinine, Ser 1.71 (*)    Calcium 8.8 (*)    GFR  calc non Af Amer 34 (*)    GFR calc Af Amer 39 (*)    All other components within normal limits  URINE MICROSCOPIC-ADD ON - Abnormal; Notable for the following:    Squamous Epithelial / LPF 0-5 (*)    Bacteria, UA MANY (*)    All other components within normal limits  URINE CULTURE  CULTURE, BLOOD (ROUTINE X 2)  CULTURE, BLOOD (ROUTINE X 2)  CBG MONITORING, ED  I-STAT CG4 LACTIC ACID, ED    Imaging Review Dg Chest 2 View  07/09/2015  CLINICAL DATA:  Weakness and altered behavior.  Questionable fall EXAM: CHEST  2 VIEW COMPARISON:  None. FINDINGS: There is scarring in the left base. There is no edema or consolidation. Heart size and pulmonary vascularity are normal. No adenopathy. No pneumothorax. No acute fracture evident. IMPRESSION: Scarring left base. No edema or consolidation. No apparent pneumothorax. Electronically Signed   By: Bretta BangWilliam  Woodruff III M.D.   On: 07/09/2015 15:56   Dg Lumbar Spine Complete  07/09/2015  CLINICAL DATA:  Pain following fall EXAM: LUMBAR SPINE - COMPLETE 4+ VIEW COMPARISON:  None. FINDINGS: Frontal, lateral, spot lumbosacral lateral, and bilateral oblique views were obtained. There are 5 non-rib-bearing lumbar type vertebral bodies. There is lumbar dextroscoliosis with rotatory component. There is anterior wedging of the L1 and L2 vertebral bodies, age uncertain. No other fractures are identified. There is no spondylolisthesis. There is moderate disc space narrowing at L2-3 and L3-4. There is facet osteoarthritic change at L3-4, L4-5, and L5-S1 bilaterally. IMPRESSION: Age uncertain anterior wedging of the L1 and L2 vertebral bodies. Scoliosis with osteoarthritic change at several levels. No spondylolisthesis. Electronically Signed   By: Bretta BangWilliam  Woodruff III M.D.   On: 07/09/2015 15:58   Ct Head Wo Contrast  07/09/2015  CLINICAL DATA:  Altered mental status with fall EXAM: CT HEAD WITHOUT CONTRAST CT CERVICAL SPINE WITHOUT CONTRAST TECHNIQUE: Multidetector CT  imaging of the head and cervical spine was performed following the standard protocol without intravenous contrast. Multiplanar CT image reconstructions of the cervical spine were also generated. COMPARISON:  None. FINDINGS: CT HEAD FINDINGS There is mild generalized atrophy. There is no intracranial mass, hemorrhage, extra-axial fluid collection, or midline shift. There is patchy small vessel disease throughout the centra semiovale bilaterally. Elsewhere gray-white compartments appear normal. No acute infarct is evident. The bony calvarium appears intact. The mastoid air cells are clear. No intraorbital lesions are evident. Paranasal sinuses are clear. CT CERVICAL SPINE FINDINGS There is no acute fracture. There is 4 mm of retrolisthesis of C5 on C6. There is minimal retrolisthesis of C6 on C7. There is no other spondylolisthesis. Prevertebral soft tissues and predental space regions are normal. There is marked bony overgrowth along the anterior arch of C2 on the left, consistent with advanced osteoarthritis and possibly representative of residua from prior trauma. No acute lesion is seen in this area. There is moderate facet hypertrophy at multiple levels bilaterally. There is exit foraminal narrowing due to bony hypertrophy at C3-4 on the right, at C4-5  bilaterally, at C5-6 bilaterally, and at C6-7 on the right due to bony hypertrophy. There is bullous disease in the lung apices bilaterally. There are foci of carotid artery calcification bilaterally. IMPRESSION: CT head: Atrophy with periventricular small vessel disease. No intracranial mass, hemorrhage, or extra-axial fluid collection. No acute infarct evident. CT cervical spine: No acute fracture. Areas of spondylolisthesis at C5-6 and C6-7 are felt to be due to underlying spondylolisthesis. Question old trauma anterior aspect of C2 on the left. Multifocal arthropathy with exit foraminal narrowing at multiple levels. Foci of carotid artery calcification  bilaterally noted. Bullous disease noted in each upper lobe/apex region. Electronically Signed   By: Bretta Bang III M.D.   On: 07/09/2015 16:53   Ct Cervical Spine Wo Contrast  07/09/2015  CLINICAL DATA:  Altered mental status with fall EXAM: CT HEAD WITHOUT CONTRAST CT CERVICAL SPINE WITHOUT CONTRAST TECHNIQUE: Multidetector CT imaging of the head and cervical spine was performed following the standard protocol without intravenous contrast. Multiplanar CT image reconstructions of the cervical spine were also generated. COMPARISON:  None. FINDINGS: CT HEAD FINDINGS There is mild generalized atrophy. There is no intracranial mass, hemorrhage, extra-axial fluid collection, or midline shift. There is patchy small vessel disease throughout the centra semiovale bilaterally. Elsewhere gray-white compartments appear normal. No acute infarct is evident. The bony calvarium appears intact. The mastoid air cells are clear. No intraorbital lesions are evident. Paranasal sinuses are clear. CT CERVICAL SPINE FINDINGS There is no acute fracture. There is 4 mm of retrolisthesis of C5 on C6. There is minimal retrolisthesis of C6 on C7. There is no other spondylolisthesis. Prevertebral soft tissues and predental space regions are normal. There is marked bony overgrowth along the anterior arch of C2 on the left, consistent with advanced osteoarthritis and possibly representative of residua from prior trauma. No acute lesion is seen in this area. There is moderate facet hypertrophy at multiple levels bilaterally. There is exit foraminal narrowing due to bony hypertrophy at C3-4 on the right, at C4-5 bilaterally, at C5-6 bilaterally, and at C6-7 on the right due to bony hypertrophy. There is bullous disease in the lung apices bilaterally. There are foci of carotid artery calcification bilaterally. IMPRESSION: CT head: Atrophy with periventricular small vessel disease. No intracranial mass, hemorrhage, or extra-axial fluid  collection. No acute infarct evident. CT cervical spine: No acute fracture. Areas of spondylolisthesis at C5-6 and C6-7 are felt to be due to underlying spondylolisthesis. Question old trauma anterior aspect of C2 on the left. Multifocal arthropathy with exit foraminal narrowing at multiple levels. Foci of carotid artery calcification bilaterally noted. Bullous disease noted in each upper lobe/apex region. Electronically Signed   By: Bretta Bang III M.D.   On: 07/09/2015 16:53   I have personally reviewed and evaluated these images and lab results as part of my medical decision-making.   EKG Interpretation   Date/Time:  Friday Jul 09 2015 14:42:35 EDT Ventricular Rate:  78 PR Interval:  136 QRS Duration: 58 QT Interval:  376 QTC Calculation: 428 R Axis:   80 Text Interpretation:  Normal sinus rhythm Cannot rule out Septal infarct ,  age undetermined Abnormal ECG No previous tracing Confirmed by CRENSHAW   MD, BRIAN ([id number]) on 07/09/2015 3:20:33 PM       MDM   Final diagnoses:  UTI (lower urinary tract infection)  Elevated serum creatinine    Possible fall.  HTN, unknown BP prior.  Could have head bleed. There is a EOM nerve palsy CN6.  Mild fever to 100.4.  No HPI sx of preceding infx sx.  Will check labs and CXR and head imaging.   CT head wo ICH/trauma Considered CVA given unknown hx of CN6 palsy, outside stroke window given wakening with sx.  No other neuro deficit on exam.  CXR neg for acute patholgy, no PNA.   UA with +nitrites, and TNTC bacteria, will need to treat; complicated UTI. No rectal pain.   AKI? No prior sCr to compare to, will start gentle fluids Rectal exam not cw prostatitis.    Pt does not have vital sign abnormalities on my exam to indicate sepsis, HR controlled, not tachypneic, normotensive, no leuks.  Treat for UTI, cultures obtained.    Will admit   Sofie Rower, MD 07/09/15 [place]  Gerhard Munch, MD 07/09/15 2127

## 2015-07-10 ENCOUNTER — Inpatient Hospital Stay (HOSPITAL_COMMUNITY): Payer: Medicare Other

## 2015-07-10 DIAGNOSIS — D638 Anemia in other chronic diseases classified elsewhere: Secondary | ICD-10-CM

## 2015-07-10 DIAGNOSIS — I6330 Cerebral infarction due to thrombosis of unspecified cerebral artery: Secondary | ICD-10-CM | POA: Diagnosis not present

## 2015-07-10 DIAGNOSIS — N390 Urinary tract infection, site not specified: Secondary | ICD-10-CM

## 2015-07-10 LAB — LIPID PANEL
CHOLESTEROL: 129 mg/dL (ref 0–200)
HDL: 54 mg/dL (ref >40)
LDL Cholesterol: 59 mg/dL (ref 0–99)
Total CHOL/HDL Ratio: 2.4 RATIO
Triglycerides: 80 mg/dL (ref <150)
VLDL: 16 mg/dL (ref 0–40)

## 2015-07-10 LAB — GLUCOSE, CAPILLARY
GLUCOSE-CAPILLARY: 48 mg/dL — AB (ref 65–99)
GLUCOSE-CAPILLARY: 57 mg/dL — AB (ref 65–99)
GLUCOSE-CAPILLARY: 69 mg/dL (ref 65–99)
GLUCOSE-CAPILLARY: 76 mg/dL (ref 65–99)
Glucose-Capillary: 138 mg/dL — ABNORMAL HIGH (ref 65–99)
Glucose-Capillary: 100 mg/dL — ABNORMAL HIGH (ref 65–99)
Glucose-Capillary: 87 mg/dL (ref 65–99)
Glucose-Capillary: 90 mg/dL (ref 65–99)
Glucose-Capillary: 98 mg/dL (ref 65–99)

## 2015-07-10 LAB — BASIC METABOLIC PANEL
Anion gap: 11 (ref 5–15)
BUN: 16 mg/dL (ref 6–20)
CALCIUM: 8.5 mg/dL — AB (ref 8.9–10.3)
CHLORIDE: 109 mmol/L (ref 101–111)
CO2: 20 mmol/L — ABNORMAL LOW (ref 22–32)
CREATININE: 1.53 mg/dL — AB (ref 0.61–1.24)
GFR, EST AFRICAN AMERICAN: 45 mL/min — AB (ref >60)
GFR, EST NON AFRICAN AMERICAN: 39 mL/min — AB (ref >60)
Glucose, Bld: 108 mg/dL — ABNORMAL HIGH (ref 65–99)
Potassium: 4.1 mmol/L (ref 3.5–5.1)
SODIUM: 140 mmol/L (ref 135–145)

## 2015-07-10 LAB — CBC
HCT: 32.7 % — ABNORMAL LOW (ref 39.0–52.0)
Hemoglobin: 10.3 g/dL — ABNORMAL LOW (ref 13.0–17.0)
MCH: 27.5 pg (ref 26.0–34.0)
MCHC: 31.5 g/dL (ref 30.0–36.0)
MCV: 87.2 fL (ref 78.0–100.0)
PLATELETS: 170 10*3/uL (ref 150–400)
RBC: 3.75 MIL/uL — AB (ref 4.22–5.81)
RDW: 14 % (ref 11.5–15.5)
WBC: 9.4 10*3/uL (ref 4.0–10.5)

## 2015-07-10 LAB — SEDIMENTATION RATE: Sed Rate: 35 mm/hr — ABNORMAL HIGH (ref 0–16)

## 2015-07-10 MED ORDER — LORAZEPAM 2 MG/ML IJ SOLN
0.5000 mg | Freq: Four times a day (QID) | INTRAMUSCULAR | Status: DC | PRN
  Administered 2015-07-10: 0.5 mg via INTRAVENOUS
  Filled 2015-07-10 (×2): qty 1

## 2015-07-10 MED ORDER — DEXTROSE-NACL 5-0.45 % IV SOLN
INTRAVENOUS | Status: DC
Start: 2015-07-10 — End: 2015-07-11
  Administered 2015-07-10 – 2015-07-11 (×4): via INTRAVENOUS

## 2015-07-10 MED ORDER — GLUCAGON HCL RDNA (DIAGNOSTIC) 1 MG IJ SOLR
INTRAMUSCULAR | Status: AC
  Filled 2015-07-10: qty 1

## 2015-07-10 MED ORDER — GLUCAGON HCL RDNA (DIAGNOSTIC) 1 MG IJ SOLR
INTRAMUSCULAR | Status: AC
  Administered 2015-07-10: 1 mg
  Filled 2015-07-10: qty 1

## 2015-07-10 MED ORDER — DEXTROSE 50 % IV SOLN
INTRAVENOUS | Status: AC
  Administered 2015-07-10: 25 mL
  Administered 2015-07-11: 25 mL via INTRAVENOUS
  Filled 2015-07-10: qty 50

## 2015-07-10 MED ORDER — ASPIRIN 325 MG PO TABS
325.0000 mg | ORAL_TABLET | Freq: Every day | ORAL | Status: DC
  Administered 2015-07-10 – 2015-07-16 (×7): 325 mg via ORAL
  Filled 2015-07-10 (×7): qty 1

## 2015-07-10 NOTE — Progress Notes (Signed)
Subjective: Continues to be confused, MRI shows a small infarct.   He has had confusion before when he went to [place].   Exam: Filed Vitals:   07/10/15 0527 07/10/15 1524  BP: 176/94 146/88  Pulse: 78 73  Temp: 98.7 F (37.1 C) 98.9 F (37.2 C)  Resp: 18 16   Gen: In bed, NAD Resp: non-labored breathing, no acute distress Abd: soft, nt  Neuro: MS: Awake, alert, does not know where he is or what year it is. He has difficulty following complex commands.  JX:BJYNWCN:PERRL, he has a partial left third nerve palsy.  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 I suspect his confusion is multifactorial including change in environement(this alone has been enough to cause him to be confused in the past), new stroke, and UTI.   His midbrain stroke I suspect is due to small vessel disease.   Recommendations: 1) LDL 59, OK 2) echo 3) asa 4) PT, OT 5) stroke team to follow.   Ritta SlotMcNeill Delania Ferg, MD [facility] [phone]  If 7pm- 7am, please page neurology on call as listed in AMION.

## 2015-07-10 NOTE — Evaluation (Addendum)
Physical Therapy Evaluation Patient Details Name: Roy Cole MRN: [id number] DOB: Feb 26, 1924 Today's Date: 07/10/2015   History of Present Illness  Shabazz Mckey is a 82 y.o. male, lives with spouse, independent of activities of daily living, PMH of anemia, reported dementia, tobacco abuse, presented to [facility] ED on 07/09/15 with complaints of confusion.  Patient's spouse and 2 sons at bedside provided history. At baseline, patient apparently has memory deficits and intermittent confusion, gradually declining over the last 1 year, independent of activities of daily living and ambulates at home without help of assistance. MRI infarct in the left paramedian midbrain    Clinical Impression  Pt admitted with above diagnosis. Pt currently with functional limitations due to the deficits listed below (see PT Problem List). Unclear level of family support on discharge. Currently requires mod-max assist of 2 to ambulate. Pt will benefit from skilled PT to increase their independence and safety with mobility to allow discharge to the venue listed below.       Follow Up Recommendations Other (comment) (TBA as caregiver support becomes clear)    Equipment Recommendations  None recommended by PT    Recommendations for Other Services OT consult     Precautions / Restrictions Precautions Precautions: Fall;Other (comment) Precaution Comments: dementia      Mobility  Bed Mobility Overal bed mobility: Needs Assistance Bed Mobility: Supine to Sit     Supine to sit: Min assist     General bed mobility comments: followed vc to sit at EOB  Transfers Overall transfer level: Needs assistance Equipment used: None Transfers: Sit to/from Stand Sit to Stand: Mod assist         General transfer comment: unsteady and confusion; assist for balance  Ambulation/Gait Ambulation/Gait assistance: Mod assist;+2 physical assistance;+2 safety/equipment Ambulation Distance (Feet): 10 Feet (toileting, 10  ft) Assistive device: 1 person hand held assist Gait Pattern/deviations: Step-through pattern;Decreased stride length;Decreased weight shift to left;Scissoring;Staggering right;Drifts right/left;Narrow base of support Gait velocity: slow Gait velocity interpretation: Below normal speed for age/gender General Gait Details: very unsteady and needing max directional cues to get from bed to bathroom; stood statically from toilet x 4 minutes with mod assist due to imbalance and restless (with RN cleaning bottom)  Stairs            Wheelchair Mobility    Modified Rankin (Stroke Patients Only) Modified Rankin (Stroke Patients Only) Pre-Morbid Rankin Score: No symptoms Modified Rankin: Moderately severe disability     Balance Overall balance assessment: Needs assistance Sitting-balance support: No upper extremity supported;Feet supported Sitting balance-Leahy Scale: Fair     Standing balance support: Single extremity supported Standing balance-Leahy Scale: Poor                               Pertinent Vitals/Pain Pain Assessment: Faces Faces Pain Scale: No hurt    Home Living Family/patient expects to be discharged to:: Unsure Living Arrangements: Spouse/significant other                    Prior Function Level of Independence: Independent         Comments: independent with ADLs and mobility; + dementia     Hand Dominance        Extremity/Trunk Assessment   Upper Extremity Assessment: Defer to OT evaluation;Difficult to assess due to impaired cognition           Lower Extremity Assessment: Difficult to assess due to impaired cognition;RLE  deficits/detail RLE Deficits / Details: unable to manually muscle test; during ambulation pt with difficulty placing RLE (tending to adduct, scissor)    Cervical / Trunk Assessment: Normal  Communication   Communication: No difficulties  Cognition Arousal/Alertness: Awake/alert Behavior During  Therapy: Restless Overall Cognitive Status: Impaired/Different from baseline Area of Impairment: Orientation;Attention;Following commands;Safety/judgement;Awareness Orientation Level: Place;Time;Situation Current Attention Level: Sustained Memory: Decreased short-term memory Following Commands: Follows one step commands inconsistently Safety/Judgement: Decreased awareness of safety;Decreased awareness of deficits     General Comments: restless in bed; incontinent of BM and unaware    General Comments General comments (skin integrity, edema, etc.): Pt with dysconjugate gaze and applied gauze over Lt eye to attempt to correct for likely double vision.     Exercises        Assessment/Plan    PT Assessment Patient needs continued PT services  PT Diagnosis Hemiplegia dominant side;Altered mental status;Difficulty walking   PT Problem List Decreased balance;Decreased mobility;Decreased cognition;Decreased knowledge of use of DME;Decreased safety awareness;Decreased knowledge of precautions;Other (comment) (dysconjugate gaze with double vision?)  PT Treatment Interventions DME instruction;Gait training;Functional mobility training;Therapeutic activities;Balance training;Neuromuscular re-education;Cognitive remediation;Patient/family education   PT Goals (Current goals can be found in the Care Plan section) Acute Rehab PT Goals Patient Stated Goal: unable PT Goal Formulation: Patient unable to participate in goal setting Time For Goal Achievement: 2015-11-04 Potential to Achieve Goals: Fair    Frequency Min 4X/week   Barriers to discharge        Co-evaluation               End of Session Equipment Utilized During Treatment: Gait belt Activity Tolerance: Patient tolerated treatment well Patient left: in bed;with nursing/sitter in room;Other (comment) (lab tech ) Nurse Communication: Mobility status;Other (comment) (use of patch lt eye to decr double vision/?improve balance)          Time: [id number] PT Time Calculation (min) (ACUTE ONLY): 22 min   Charges:   PT Evaluation $PT Eval Low Complexity: 1 Procedure     PT G Codes:        Haynes Giannotti 07/10/2015, 12:31 PM Pager [phone]

## 2015-07-10 NOTE — Progress Notes (Signed)
PT Cancellation Note  Patient Details Name: Rica Recordsnderson Quayle MRN: [id number] DOB: 13-Aug-1923   Cancelled Treatment:    Reason Eval/Treat Not Completed: Other (comment)  Patient sleeping soundly (per chart agitated overnight). Usually sleeps until 10 AM per chart. MRI also still pending. Will attempt to see later today.  Khaylee Mcevoy 07/10/2015, 9:04 AM  Pager [phone]

## 2015-07-10 NOTE — Progress Notes (Addendum)
PROGRESS NOTE  Roy Cole  WUJ:[id number] DOB: 28-Nov-1923  DOA: 07/09/2015 PCP: No primary care provider on file.  Outpatient Specialists:  None  Brief Narrative:  82 y.o. male, married, lives with spouse, independent of activities of daily living, has not seen a physician in decades, PMH of anemia, reported dementia, tobacco abuse, presented to [facility] ED on 07/09/15 with complaints of confusion, possible fall with small skin tears, hypoglycemia. Evaluation in ED revealed acute on chronic kidney disease, UTI, disconjugate gaze/rule out stroke. Admitted to telemetry. Neurology consulted.   Assessment & Plan:   Principal Problem:   UTI (lower urinary tract infection) Active Problems:   Acute encephalopathy   Dehydration   Renal failure, acute on chronic (HCC)   Anemia   Dementia   Tobacco abuse   Hypoglycemia   UTI - Continue IV Rocephin pending urine culture results.  Dehydration - IV fluids. Improved. Continue additional 24 hours of gentle IV fluids.  Acute encephalopathy - Secondary to acute medical illness (UTI, dehydration, acute kidney injury, stroke) complicating underlying dementia - Treat underlying cause and monitor.  - Appears slightly better. Not as restless as he was in ED. Oriented to self.  Acute on chronic kidney disease - Baseline creatinine not known. Presented with creatinine of 1.7. - Hydrated with IV fluids and follow BMP. - Renal ultrasound: No right-sided hydronephrosis. Left kidney not visualized. - Creatinine has improved to 1.4.  Anemia, chronic, possibly chronic disease versus iron deficiency - Anemia panel results as below. Hemoglobin stable.  Dementia with behavioral disturbance - As per spouse, felt to have dementia for the last year and gradually declining. - Mental status worse than baseline. Normally he is calm. - B-12: 398. TSH 1.952.  Acute left brain stroke/Disconjugate gaze - Neurology was consulted and recommended MRI brain - MRI:  Acute, nonhemorrhagic infarct in the left paramedian midbrain. - Complete stroke workup: 2-D echo, carotid Dopplers, lipids. Follow A1c. - Patient may not be able to cooperate for MRA brain. Neurology to advise if needs to do MRA under sedation versus TCD's. - Start aspirin. Bedside RN stroke eval - Place on telemetry.  Hypoglycemia - ? Secondary to poor oral intake. IV fluids changed to dextrose containing fluids. Encourage oral intake. Monitor  Tobacco abuse - Nicotine patch  Possible fall at home with skin tears - PT and OT evaluation.  Hypertension - Allow for permissive hypertension given recent acute stroke.  Right kidney superior pole lesion, seen on renal ultrasound - Elective outpatient workup as deemed necessary.   DVT prophylaxis: Lovenox Code Status: Full Family Communication: Discussed with spouse in detail, updated care and answered questions. Disposition Plan: Probably will need SNF when medically stable.   Consultants:   Neurology  Procedures:   None  Antimicrobials:   IV Rocephin 5/5 >    Subjective: Patient less confused and less restless. Oriented to self. Does not follow instructions.  Objective:  Filed Vitals:   07/09/15 1904 07/09/15 1920 07/09/15 2004 07/10/15 0527  BP: 142/103  184/157 176/94  Pulse:   79 78  Temp:  99.7 F (37.6 C) 98 F (36.7 C) 98.7 F (37.1 C)  TempSrc:      Resp:   18 18  Height:      Weight:      SpO2:   76% 45%    Intake/Output Summary (Last 24 hours) at 07/10/15 1155 Last data filed at 07/10/15 0900  Gross per 24 hour  Intake    755 ml  Output  0 ml  Net    755 ml   Filed Weights   07/09/15 1440  Weight: 54.432 kg (120 lb)    Examination:  General exam: Moderately built, frail, cachectic, elderly male, chronically ill looking, lying comfortably supine in bed. Appears calm and comfortable  Respiratory system: Clear to auscultation. Respiratory effort normal. Cardiovascular system: S1 & S2  heard, RRR.Marland Kitchen. No JVD, murmurs, rubs, gallops or clicks. No pedal edema. Gastrointestinal system: Abdomen is nondistended, soft and nontender. No organomegaly or masses felt. Normal bowel sounds heard. Central nervous system: Alert and oriented only to self .  Disconjugate eye movements-seems slightly better compared to admission. Extremities: Symmetric 5 x 5 power. Skin: Superficial skin tears over right scapula, right hip and a larger skin tear over right mid forearm. No other acute findings noted. Psychiatry:  Unable to assess due to confusion.     Data Reviewed: I have personally reviewed following labs and imaging studies  CBC:  Recent Labs Lab 07/09/15 1523 07/10/15 0335  WBC 9.3 9.4  HGB 10.9* 10.3*  HCT 33.6* 32.7*  MCV 87.3 87.2  PLT 167 170   Basic Metabolic Panel:  Recent Labs Lab 07/09/15 1523 07/10/15 0335  NA 142 140  K 4.2 4.1  CL 110 109  CO2 22 20*  GLUCOSE 95 108*  BUN 14 16  CREATININE 1.71* 1.53*  CALCIUM 8.8* 8.5*   GFR: Estimated Creatinine Clearance: 25.2 mL/min (by C-G formula based on Cr of 1.53). Liver Function Tests:  Recent Labs Lab 07/09/15 1523  AST 24  ALT 17  ALKPHOS 59  BILITOT 0.8  PROT 7.0  ALBUMIN 3.5   No results for input(s): LIPASE, AMYLASE in the last 168 hours. No results for input(s): AMMONIA in the last 168 hours. Coagulation Profile: No results for input(s): INR, PROTIME in the last 168 hours. Cardiac Enzymes: No results for input(s): CKTOTAL, CKMB, CKMBINDEX, TROPONINI in the last 168 hours. BNP (last 3 results) No results for input(s): PROBNP in the last 8760 hours. HbA1C: No results for input(s): HGBA1C in the last 72 hours. CBG:  Recent Labs Lab 07/09/15 1510 07/09/15 2351 07/10/15 0519 07/10/15 0612 07/10/15 0851  GLUCAP 75 76 57* 138* 100*   Lipid Profile: No results for input(s): CHOL, HDL, LDLCALC, TRIG, CHOLHDL, LDLDIRECT in the last 72 hours. Thyroid Function Tests:  Recent Labs   07/09/15 2228  TSH 1.952   Anemia Panel:  Recent Labs  07/09/15 2228  VITAMINB12 398  FOLATE 14.2  FERRITIN 503*  TIBC 245*  IRON 20*  RETICCTPCT 1.3   Urine analysis:    Component Value Date/Time   COLORURINE YELLOW 07/09/2015 1527   APPEARANCEUR CLOUDY* 07/09/2015 1527   LABSPEC 1.012 07/09/2015 1527   PHURINE 5.5 07/09/2015 1527   GLUCOSEU NEGATIVE 07/09/2015 1527   HGBUR NEGATIVE 07/09/2015 1527   BILIRUBINUR NEGATIVE 07/09/2015 1527   KETONESUR NEGATIVE 07/09/2015 1527   PROTEINUR NEGATIVE 07/09/2015 1527   NITRITE POSITIVE* 07/09/2015 1527   LEUKOCYTESUR SMALL* 07/09/2015 1527   Sepsis Labs: @LABRCNTIP (procalcitonin:4,lacticidven:4)  )No results found for this or any previous visit (from the past 240 hour(s)).       Radiology Studies: Dg Chest 2 View  07/09/2015  CLINICAL DATA:  Weakness and altered behavior.  Questionable fall EXAM: CHEST  2 VIEW COMPARISON:  None. FINDINGS: There is scarring in the left base. There is no edema or consolidation. Heart size and pulmonary vascularity are normal. No adenopathy. No pneumothorax. No acute fracture evident. IMPRESSION: Scarring left  base. No edema or consolidation. No apparent pneumothorax. Electronically Signed   By: Bretta Bang III M.D.   On: 07/09/2015 15:56   Dg Lumbar Spine Complete  07/09/2015  CLINICAL DATA:  Pain following fall EXAM: LUMBAR SPINE - COMPLETE 4+ VIEW COMPARISON:  None. FINDINGS: Frontal, lateral, spot lumbosacral lateral, and bilateral oblique views were obtained. There are 5 non-rib-bearing lumbar type vertebral bodies. There is lumbar dextroscoliosis with rotatory component. There is anterior wedging of the L1 and L2 vertebral bodies, age uncertain. No other fractures are identified. There is no spondylolisthesis. There is moderate disc space narrowing at L2-3 and L3-4. There is facet osteoarthritic change at L3-4, L4-5, and L5-S1 bilaterally. IMPRESSION: Age uncertain anterior wedging of the  L1 and L2 vertebral bodies. Scoliosis with osteoarthritic change at several levels. No spondylolisthesis. Electronically Signed   By: Bretta Bang III M.D.   On: 07/09/2015 15:58   Ct Head Wo Contrast  07/09/2015  CLINICAL DATA:  Altered mental status with fall EXAM: CT HEAD WITHOUT CONTRAST CT CERVICAL SPINE WITHOUT CONTRAST TECHNIQUE: Multidetector CT imaging of the head and cervical spine was performed following the standard protocol without intravenous contrast. Multiplanar CT image reconstructions of the cervical spine were also generated. COMPARISON:  None. FINDINGS: CT HEAD FINDINGS There is mild generalized atrophy. There is no intracranial mass, hemorrhage, extra-axial fluid collection, or midline shift. There is patchy small vessel disease throughout the centra semiovale bilaterally. Elsewhere gray-white compartments appear normal. No acute infarct is evident. The bony calvarium appears intact. The mastoid air cells are clear. No intraorbital lesions are evident. Paranasal sinuses are clear. CT CERVICAL SPINE FINDINGS There is no acute fracture. There is 4 mm of retrolisthesis of C5 on C6. There is minimal retrolisthesis of C6 on C7. There is no other spondylolisthesis. Prevertebral soft tissues and predental space regions are normal. There is marked bony overgrowth along the anterior arch of C2 on the left, consistent with advanced osteoarthritis and possibly representative of residua from prior trauma. No acute lesion is seen in this area. There is moderate facet hypertrophy at multiple levels bilaterally. There is exit foraminal narrowing due to bony hypertrophy at C3-4 on the right, at C4-5 bilaterally, at C5-6 bilaterally, and at C6-7 on the right due to bony hypertrophy. There is bullous disease in the lung apices bilaterally. There are foci of carotid artery calcification bilaterally. IMPRESSION: CT head: Atrophy with periventricular small vessel disease. No intracranial mass, hemorrhage, or  extra-axial fluid collection. No acute infarct evident. CT cervical spine: No acute fracture. Areas of spondylolisthesis at C5-6 and C6-7 are felt to be due to underlying spondylolisthesis. Question old trauma anterior aspect of C2 on the left. Multifocal arthropathy with exit foraminal narrowing at multiple levels. Foci of carotid artery calcification bilaterally noted. Bullous disease noted in each upper lobe/apex region. Electronically Signed   By: Bretta Bang III M.D.   On: 07/09/2015 16:53   Ct Cervical Spine Wo Contrast  07/09/2015  CLINICAL DATA:  Altered mental status with fall EXAM: CT HEAD WITHOUT CONTRAST CT CERVICAL SPINE WITHOUT CONTRAST TECHNIQUE: Multidetector CT imaging of the head and cervical spine was performed following the standard protocol without intravenous contrast. Multiplanar CT image reconstructions of the cervical spine were also generated. COMPARISON:  None. FINDINGS: CT HEAD FINDINGS There is mild generalized atrophy. There is no intracranial mass, hemorrhage, extra-axial fluid collection, or midline shift. There is patchy small vessel disease throughout the centra semiovale bilaterally. Elsewhere gray-white compartments appear normal. No acute  infarct is evident. The bony calvarium appears intact. The mastoid air cells are clear. No intraorbital lesions are evident. Paranasal sinuses are clear. CT CERVICAL SPINE FINDINGS There is no acute fracture. There is 4 mm of retrolisthesis of C5 on C6. There is minimal retrolisthesis of C6 on C7. There is no other spondylolisthesis. Prevertebral soft tissues and predental space regions are normal. There is marked bony overgrowth along the anterior arch of C2 on the left, consistent with advanced osteoarthritis and possibly representative of residua from prior trauma. No acute lesion is seen in this area. There is moderate facet hypertrophy at multiple levels bilaterally. There is exit foraminal narrowing due to bony hypertrophy at C3-4  on the right, at C4-5 bilaterally, at C5-6 bilaterally, and at C6-7 on the right due to bony hypertrophy. There is bullous disease in the lung apices bilaterally. There are foci of carotid artery calcification bilaterally. IMPRESSION: CT head: Atrophy with periventricular small vessel disease. No intracranial mass, hemorrhage, or extra-axial fluid collection. No acute infarct evident. CT cervical spine: No acute fracture. Areas of spondylolisthesis at C5-6 and C6-7 are felt to be due to underlying spondylolisthesis. Question old trauma anterior aspect of C2 on the left. Multifocal arthropathy with exit foraminal narrowing at multiple levels. Foci of carotid artery calcification bilaterally noted. Bullous disease noted in each upper lobe/apex region. Electronically Signed   By: Bretta Bang III M.D.   On: 07/09/2015 16:53   Mr Brain Wo Contrast  07/10/2015  CLINICAL DATA:  Gaze palsy EXAM: MRI HEAD WITHOUT CONTRAST TECHNIQUE: Multiplanar, multiecho pulse sequences of the brain and surrounding structures were obtained without intravenous contrast. COMPARISON:  Head CT from yesterday FINDINGS: Motion degraded study with late acquired sequences nondiagnostic. Calvarium and upper cervical spine: No focal marrow signal abnormality. Orbits: Negative. Sinuses and Mastoids: Clear. Brain: Linear restricted diffusion left of midline in the midbrain extending from the interpeduncular fossa to cerebral aqua duct. No superimposed hemorrhage. No acute infarct elsewhere. No evidence of major vessel occlusion. There is normal flow related signal loss in the basilar and visible branches. Generalized atrophy. Age congruent small-vessel ischemic gliosis in the periventricular white matter. IMPRESSION: 1. Acute, nonhemorrhagic infarct in the left paramedian midbrain. 2. Generalized atrophy. 3. Motion degraded study with multiple nondiagnostic sequences. Electronically Signed   By: Marnee Spring M.D.   On: 07/10/2015 11:14    US Renal  07/09/2015  CLINICAL DATA:  Patient with acute renal failure. EXAM: RENAL / URINARY TRACT ULTRASOUND COMPLETE COMPARISON:  None. FINDINGS: Right Kidney: Length: 9.2 cm. Normal renal cortical thickness and echogenicity. No hydronephrosis. Within the superior pole of the right kidney there is a 3.0 x 2.3 x 2.7 cm simple cyst. Additionally within the superior pole there there is an adjacent 2.1 x 1.6 x 2.4 cm hypoechoic lesion. Left Kidney: Not visualized. Bladder: Distended. IMPRESSION: No right-sided hydronephrosis. The left kidney is not able to be visualized. Indeterminate hypoechoic lesion superior pole right kidney. In the nonacute setting, recommend correlation with pre and post contrast-enhanced CT or MRI for definitive characterization and to exclude the possibility of solid mass. Electronically Signed   By: Annia Belt M.D.   On: 07/09/2015 20:44        Scheduled Meds: . cefTRIAXone (ROCEPHIN)  IV  1 g Intravenous Q24H  . enoxaparin (LOVENOX) injection  30 mg Subcutaneous Q24H  . feeding supplement (ENSURE ENLIVE)  237 mL Oral BID BM  . multivitamin with minerals  1 tablet Oral Daily  . nicotine  14 mg Transdermal Q2000   Continuous Infusions: . dextrose 5 % and 0.45% NaCl 100 mL/hr at 07/10/15 0938     LOS: 1 day    Time spent: 40 minutes.    [facility], MD [facility] Pager [phone]  If 7PM-7AM, please contact night-coverage [email or web address] Password [email or web address] 07/10/2015, 11:55 AM

## 2015-07-10 NOTE — Progress Notes (Signed)
Initial Nutrition Assessment  DOCUMENTATION CODES:  Underweight, Severe malnutrition in context of chronic illness   Pt meets criteria for SEVERE MALNUTRITION in the context of Chronic illness as evidenced by Severe muscle/fat loss.  INTERVENTION:  Downgrade to soft diet as family member believe this would be of benefit.   Ensure Enlive po BID, each supplement provides 350 kcal and 20 grams of protein  Magic cup BID with meals, each supplement provides 290 kcal and 9 grams of protein  MVI with minerals  NUTRITION DIAGNOSIS:  Inadequate oral intake related to chronic illness (dementia) as evidenced by severe muscle/fat depletion  GOAL:  Patient will meet greater than or equal to 90% of their needs  MONITOR:  PO intake, Supplement acceptance, Labs  REASON FOR ASSESSMENT:  Malnutrition Screening Tool    ASSESSMENT:  82 y/o male. has not seen a physician in decades, PMH of anemia, reported dementia, tobacco abuse, [facility]15 with complaints of confusion/ams. Worked up for UTI, AoC CKD and nonhemorrhagic stroke  Spoke with two sons at bedside. They report that pt has had very minimal intake for "about 1 year". They state it is largely related to the progression of his dementia. They say patient has an appetite, but when he goes to eat he will only take a couple bites. Poor cognition has severely impacted his nutrition, sons stating "it is like he forgot how to eat". Other problems include dizziness and constipation which both could be due to or associated with poor hydration/poor po intake. No n/v or diarrhea.  Pt has false teeth, though these are not with him. Sons report patient does best with soft items such as mashed potatoes or items that can be eaten with a spoon. Will downgrade to soft diet.   Pt did not take any vitamin/mineral supplements nor did he drink any nutritional supplements. RD reccommended high kcal/pro beverage on D/C to hlp maintain weight. They  reveal that pt does not like chocolate, potentially he may like strawberry or vanilla. RD also listed high kcal/pro foods such as PB, whole milk, cheese, eggs. Sons say patient likes all of these items  Pt is very apt to try to get out of bed and attempted to spring out of bed towards end of conversation.   Family states pt lives with spouse who is care giver.   Pt's UBW is 140-150 though it is unclear the last time he weighed this amount.   NFPE: Physical assessment reveals severe muscle/fat wasting.   Labs reviewed: Anemic, Lipid panel WDL   Recent Labs Lab 07/09/15 1523 07/10/15 0335  NA 142 140  K 4.2 4.1  CL 110 109  CO2 22 20*  BUN 14 16  CREATININE 1.71* 1.53*  CALCIUM 8.8* 8.5*  GLUCOSE 95 108*   Diet Order:  DIET SOFT Room service appropriate?: Yes; Fluid consistency:: Thin  Skin: Dry w/ scattered abrasions  Last BM:  Unknown  Height:  Ht Readings from Last 1 Encounters:  07/09/15 5\' 8"  (1.727 m)   Weight:  Wt Readings from Last 1 Encounters:  07/09/15 120 lb (54.432 kg)  No wt hx  Ideal Body Weight:  70 kg  BMI:  Body mass index is 18.25 kg/(m^2).  Estimated Nutritional Needs:  Kcal:  1750-1950 (32-36 kcal/kg bw) Protein:  60-70 g Pro (1.1-1.3 g/kg bw) Fluid:  1.8-2 liters  EDUCATION NEEDS:  No education needs identified at this time  Christophe LouisNathan Franks RD, LDN Clinical Nutrition Pager: [phone] 07/10/2015 3:46  PM

## 2015-07-10 NOTE — Progress Notes (Signed)
MRI brain w/o cm done. Limited due to motion sent what pt could tolerate th rough to Radiologist. If further imaging is ordered please consider sedation ; a MRA for example will not be possible with pt moving this much.

## 2015-07-10 NOTE — Progress Notes (Addendum)
Blood sugar at 16:48--69 Pt wife in process of giving him some coffee and he has strawberry ensure.  Will re-check in 15 min.  Re-check--46. Gave GlucaGen 1 mg. Will re-check in 15 min.

## 2015-07-10 NOTE — Progress Notes (Addendum)
Hypoglycemic Event  CBG: 57  Treatment: D5 25ml  Symptoms: restless/irritable  Follow-up CBG: Time: 6:10 CBG Result: 135  Possible Reasons for Event: Inadequate meal intake  Comments/MD notified: MD notified  [name], Roy Cole

## 2015-07-11 ENCOUNTER — Inpatient Hospital Stay (HOSPITAL_COMMUNITY): Payer: Medicare Other

## 2015-07-11 DIAGNOSIS — I6789 Other cerebrovascular disease: Secondary | ICD-10-CM

## 2015-07-11 DIAGNOSIS — E43 Unspecified severe protein-calorie malnutrition: Secondary | ICD-10-CM | POA: Insufficient documentation

## 2015-07-11 DIAGNOSIS — I6330 Cerebral infarction due to thrombosis of unspecified cerebral artery: Secondary | ICD-10-CM | POA: Insufficient documentation

## 2015-07-11 DIAGNOSIS — I639 Cerebral infarction, unspecified: Secondary | ICD-10-CM

## 2015-07-11 LAB — BRAIN NATRIURETIC PEPTIDE: B Natriuretic Peptide: 232.7 pg/mL — ABNORMAL HIGH (ref 0.0–100.0)

## 2015-07-11 LAB — BASIC METABOLIC PANEL
ANION GAP: 10 (ref 5–15)
ANION GAP: 9 (ref 5–15)
Anion gap: 12 (ref 5–15)
BUN: 15 mg/dL (ref 6–20)
BUN: 13 mg/dL (ref 6–20)
BUN: 13 mg/dL (ref 6–20)
CALCIUM: 8.3 mg/dL — AB (ref 8.9–10.3)
CALCIUM: 8.4 mg/dL — AB (ref 8.9–10.3)
CHLORIDE: 105 mmol/L (ref 101–111)
CHLORIDE: 101 mmol/L (ref 101–111)
CO2: 20 mmol/L — ABNORMAL LOW (ref 22–32)
CO2: 22 mmol/L (ref 22–32)
CO2: 23 mmol/L (ref 22–32)
CREATININE: 1.52 mg/dL — AB (ref 0.61–1.24)
Calcium: 8.1 mg/dL — ABNORMAL LOW (ref 8.9–10.3)
Chloride: 103 mmol/L (ref 101–111)
Creatinine, Ser: 1.5 mg/dL — ABNORMAL HIGH (ref 0.61–1.24)
Creatinine, Ser: 1.46 mg/dL — ABNORMAL HIGH (ref 0.61–1.24)
GFR calc Af Amer: 47 mL/min — ABNORMAL LOW (ref >60)
GFR calc non Af Amer: 39 mL/min — ABNORMAL LOW (ref >60)
GFR calc non Af Amer: 40 mL/min — ABNORMAL LOW (ref >60)
GFR, EST AFRICAN AMERICAN: 45 mL/min — AB (ref >60)
GFR, EST AFRICAN AMERICAN: 46 mL/min — AB (ref >60)
GFR, EST NON AFRICAN AMERICAN: 41 mL/min — AB (ref >60)
GLUCOSE: 87 mg/dL (ref 65–99)
GLUCOSE: 72 mg/dL (ref 65–99)
Glucose, Bld: 135 mg/dL — ABNORMAL HIGH (ref 65–99)
POTASSIUM: 3.7 mmol/L (ref 3.5–5.1)
POTASSIUM: 3.4 mmol/L — AB (ref 3.5–5.1)
Potassium: 4 mmol/L (ref 3.5–5.1)
SODIUM: 137 mmol/L (ref 135–145)
Sodium: 135 mmol/L (ref 135–145)
Sodium: 133 mmol/L — ABNORMAL LOW (ref 135–145)

## 2015-07-11 LAB — BLOOD GAS, ARTERIAL
Acid-base deficit: 3 mmol/L — ABNORMAL HIGH (ref 0.0–2.0)
BICARBONATE: 20.4 meq/L (ref 20.0–24.0)
DRAWN BY: 345601
O2 Content: 3 L/min
O2 SAT: 98.3 %
Patient temperature: 98.6
TCO2: 21.3 mmol/L (ref 0–100)
pCO2 arterial: 29.7 mmHg — ABNORMAL LOW (ref 35.0–45.0)
pH, Arterial: 7.451 — ABNORMAL HIGH (ref 7.350–7.450)
pO2, Arterial: 110 mmHg — ABNORMAL HIGH (ref 80.0–100.0)

## 2015-07-11 LAB — GLUCOSE, CAPILLARY
GLUCOSE-CAPILLARY: 48 mg/dL — AB (ref 65–99)
GLUCOSE-CAPILLARY: 61 mg/dL — AB (ref 65–99)
GLUCOSE-CAPILLARY: 139 mg/dL — AB (ref 65–99)
GLUCOSE-CAPILLARY: 65 mg/dL (ref 65–99)
Glucose-Capillary: 127 mg/dL — ABNORMAL HIGH (ref 65–99)
Glucose-Capillary: 91 mg/dL (ref 65–99)
Glucose-Capillary: 61 mg/dL — ABNORMAL LOW (ref 65–99)
Glucose-Capillary: 89 mg/dL (ref 65–99)
Glucose-Capillary: 89 mg/dL (ref 65–99)

## 2015-07-11 LAB — CBC
HEMATOCRIT: 30.8 % — AB (ref 39.0–52.0)
Hemoglobin: 10.1 g/dL — ABNORMAL LOW (ref 13.0–17.0)
MCH: 27.9 pg (ref 26.0–34.0)
MCHC: 32.8 g/dL (ref 30.0–36.0)
MCV: 85.1 fL (ref 78.0–100.0)
PLATELETS: 156 10*3/uL (ref 150–400)
RBC: 3.62 MIL/uL — ABNORMAL LOW (ref 4.22–5.81)
RDW: 13.8 % (ref 11.5–15.5)
WBC: 8.4 10*3/uL (ref 4.0–10.5)

## 2015-07-11 LAB — ECHOCARDIOGRAM COMPLETE
HEIGHTINCHES: 68 in
WEIGHTICAEL: 1920 [oz_av]

## 2015-07-11 LAB — URINE CULTURE: Special Requests: NORMAL

## 2015-07-11 LAB — TROPONIN I: TROPONIN I: 0.24 ng/mL — AB (ref <0.031)

## 2015-07-11 MED ORDER — DEXTROSE 50 % IV SOLN
INTRAVENOUS | Status: AC
  Filled 2015-07-11: qty 50

## 2015-07-11 MED ORDER — DEXTROSE 50 % IV SOLN
25.0000 mL | Freq: Once | INTRAVENOUS | Status: AC
  Administered 2015-07-11: 25 mL via INTRAVENOUS

## 2015-07-11 MED ORDER — DEXTROSE 50 % IV SOLN
INTRAVENOUS | Status: AC
  Administered 2015-07-11: 25 mL
  Filled 2015-07-11: qty 50

## 2015-07-11 MED ORDER — DEXTROSE 10 % IV SOLN
INTRAVENOUS | Status: AC
  Administered 2015-07-11 – 2015-07-12 (×2): via INTRAVENOUS

## 2015-07-11 MED ORDER — HALOPERIDOL LACTATE 5 MG/ML IJ SOLN
1.0000 mg | Freq: Two times a day (BID) | INTRAMUSCULAR | Status: DC | PRN
  Administered 2015-07-12 – 2015-07-14 (×3): 1 mg via INTRAVENOUS
  Filled 2015-07-11 (×3): qty 1

## 2015-07-11 MED ORDER — DEXTROSE 50 % IV SOLN
INTRAVENOUS | Status: AC
  Administered 2015-07-11: 21:00:00
  Filled 2015-07-11: qty 50

## 2015-07-11 NOTE — Progress Notes (Signed)
STROKE TEAM PROGRESS NOTE   HISTORY OF PRESENT ILLNESS Roy Cole is an 82 yo male with hx of dementia who presents today post fall at home, found to have a UTI is presenting with disconjugate gaze which per family appears to be new. Has severe dementia at baseline but more altered here due to UTI. The family noted some bruising and believes the patient may have fallen.  TPA was not administered secondary to unknown time of onset and minimal deficits.   SUBJECTIVE (INTERVAL HISTORY) The patient's wife was at the bedside. The patient was minimally responsive. He was unable to answer questions and speech was unintelligible. He did not follow commands.I obtained detailed history from patient`s wife. He has mild dementia at baseline and needs supervision but is mostly independent for most ADLs   OBJECTIVE Temp:  [98.2 F (36.8 C)-98.9 F (37.2 C)] 98.4 F (36.9 C) (05/07 1440) Pulse Rate:  [66-73] 70 (05/07 1440) Cardiac Rhythm:  [-] Normal sinus rhythm (05/07 1025) Resp:  [16-18] 16 (05/07 1440) BP: (136-159)/(68-90) 159/89 mmHg (05/07 1440) SpO2:  [94 %-100 %] 100 % (05/07 1440)  CBC:   Recent Labs Lab 07/09/15 1523 07/10/15 0335  WBC 9.3 9.4  HGB 10.9* 10.3*  HCT 33.6* 32.7*  MCV 87.3 87.2  PLT 167 170    Basic Metabolic Panel:   Recent Labs Lab 07/10/15 0335 07/11/15 0608  NA 140 137  K 4.1 4.0  CL 109 105  CO2 20* 20*  GLUCOSE 108* 135*  BUN 16 15  CREATININE 1.53* 1.52*  CALCIUM 8.5* 8.3*    Lipid Panel:     Component Value Date/Time   CHOL 129 07/10/2015 1213   TRIG 80 07/10/2015 1213   HDL 54 07/10/2015 1213   CHOLHDL 2.4 07/10/2015 1213   VLDL 16 07/10/2015 1213   LDLCALC 59 07/10/2015 1213   HgbA1c: No results found for: HGBA1C Urine Drug Screen: No results found for: LABOPIA, COCAINSCRNUR, LABBENZ, AMPHETMU, THCU, LABBARB    IMAGING  Dg Chest 2 View 07/09/2015   Scarring left base. No edema or consolidation. No apparent pneumothorax.      Dg Lumbar Spine Complete 07/09/2015   Age uncertain anterior wedging of the L1 and L2 vertebral bodies. Scoliosis with osteoarthritic change at several levels. No spondylolisthesis.     Ct Head Wo Contrast 07/09/2015   Atrophy with periventricular small vessel disease. No intracranial mass, hemorrhage, or extra-axial fluid collection. No acute infarct evident.    Ct Cervical Spine Wo Contrast 07/09/2015   No acute fracture. Areas of spondylolisthesis at C5-6 and C6-7 are felt to be due to underlying spondylolisthesis. Question old trauma anterior aspect of C2 on the left. Multifocal arthropathy with exit foraminal narrowing at multiple levels.   Foci of carotid artery calcification bilaterally noted. Bullous disease noted in each upper lobe/apex region.     Mr Brain Wo Contrast 07/10/2015   1. Acute, nonhemorrhagic infarct in the left paramedian midbrain.  2. Generalized atrophy.  3. Motion degraded study with multiple nondiagnostic sequences.     US Renal 07/09/2015   No right-sided hydronephrosis.  The left kidney is not able to be visualized.  Indeterminate hypoechoic lesion superior pole right kidney.  In the nonacute setting, recommend correlation with pre and post contrast-enhanced CT or MRI for definitive characterization and to exclude the possibility of solid mass.     PHYSICAL EXAM Frail elderly african [place] male not in distress. . Afebrile. Head is nontraumatic. Neck is supple without bruit.  Cardiac exam no murmur or gallop. Lungs are clear to auscultation. Distal pulses are well felt.  Neurological Exam :  Stuporose. Eyes closed arouses with some difficulty. Severely dysarthric and cannot be easily understood. Follows only midline occasional commands.pupils 3 mm reactive. Not following eye movement commands. Left gaze preference. Mild right lower face weakness. Tongue midline. Moves all 4 limbs purposefully to pain . No focal weakness. Withdraws to pain  equally. Plantars downgoing  ASSESSMENT/PLAN Mr. Roy Cole is a 82 y.o. male with history of severe dementia, ongoing tobacco use, and anemia presenting with disconjugate gaze. He did not receive IV t-PA due to unknown time of onset and minimal deficits.  Stroke:  Dominant infarct secondary to small vessel disease.  Resultant disconjugate gaze  MRI  Acute, nonhemorrhagic infarct in the left paramedian midbrain.   MRA  not performed  Carotid Doppler - 1-39% internal carotid artery stenosis bilaterally. Vertebral arteries are patent with antegrade flow.  2D Echo - EF 55-60%. No cardiac source of emboli identified.  LDL - 59  HgbA1c pending  VTE prophylaxis - Lovenox DIET - DYS 1 Room service appropriate?: Yes; Fluid consistency:: Thin  No antithrombotic prior to admission, now on aspirin 325 mg daily  Ongoing aggressive stroke risk factor management  Therapy recommendations: Pending  Disposition: Pending  Hypertension  Stable  Permissive hypertension (OK if < 220/120) but gradually normalize in 5-7 days  Hyperlipidemia  Home meds: No lipid lowering medications prior to admission  LDL 59, goal < 70   Other Stroke Risk Factors  Advanced age  Cigarette smoker, will be advised to stop smoking     Other Active Problems  UTI - day #2 IV Rocephin  Anemia  Renal insufficiency  Possible mass on renal ultrasound  Hospital day # 2  Delton Seeavid Rinehuls PA-C [facility] Pager [phone] 07/11/2015, 3:40 PM I have personally examined this patient, reviewed notes, independently viewed imaging studies, participated in medical decision making and plan of care. I have made any additions or clarifications directly to the above note. Agree with note above. He presented with  severe dysarthria   due to  Brainstem infarct  and remains at risk for neurological worsening, recurrent stroke/TIA and needs ongoing stroke evaluation and aggressive risk factor  modification.continue aspirin rectally till able to swallow safely. Long d/w wife at bedside and answered questions. She requested no invasive tests.Greater than 50% time during this 25 minute visit was spent on counselling and coordination of care about stroke risk and prevention.   Delia HeadyPramod Jimmie Dattilio, MD Medical Director [facility] Pager: [phone] 07/11/2015 3:45 PM     To contact Stroke Continuity provider, please refer to [email or web address]. After hours, contact General Neurology

## 2015-07-11 NOTE — Progress Notes (Signed)
*  PRELIMINARY RESULTS* Vascular Ultrasound Carotid Duplex (Doppler) has been completed. There is evidence of 1-39% internal carotid artery stenosis bilaterally. Vertebral arteries are patent with antegrade flow.  07/11/2015 9:51 AM Gertie FeyMichelle Addley Ballinger, RVT, RDCS, RDMS

## 2015-07-11 NOTE — Evaluation (Signed)
Clinical/Bedside Swallow Evaluation Patient Details  Name: Roy Cole MRN: [id number] Date of Birth: [birth year]  Today's Date: 07/11/2015 Time: SLP Start Time (ACUTE ONLY): 0836 SLP Stop Time (ACUTE ONLY): 0849 SLP Time Calculation (min) (ACUTE ONLY): 13 min  Past Medical History:  Past Medical History  Diagnosis Date  . Anemia   . Dementia    Past Surgical History:  Past Surgical History  Procedure Laterality Date  . No past surgeries     HPI:  82 yo M with midbrain stroke. I suspect his confusion is multifactorial including change in environement(this alone has been enough to cause him to be confused in the past), new stroke, and UTI.    Assessment / Plan / Recommendation Clinical Impression  Pt referred for clinical assessment of swalow function following midbrain stroke in a gentleman whose cognitive picture is also compromised by UTI and underlying dementia. Family sleeping in the room, however they did not rouse during assessment. Per dietician note, pt was eating very soft items such as mashed potatoes at home with very minimal intake. This date despite upright positioning pt was poorly able to participate with swallow assessment. Limitations appear to be related to cognitive impairment. For example, with thins pt unable to demonstrate labial seal on the cup and consistently blew into the straw rather than sucking. He required pippetting of liquid into his mouth. Pt closed his mouth with puree and demo'd oral holding with limited amount in the mouth- requiring liquid wash to clear. Pharyngeal phase appears somewhat delayed and discoordinated. Pt is certainly at risk for aspiration given cognitive status. Recommend: Dys 1 (puree) with thins and 1:1 assist to reinforce aspiration precautions. Will follow to determine if pt appropriate for upgrade or possibly an objective measure of swallow function. Discussed POC with RN who verbalized agreement.    Aspiration Risk  Moderate  aspiration risk    Diet Recommendation Dysphagia 1 (Puree);Thin liquid   Liquid Administration via:  (small sips via whatever means pt will accept liquids) Medication Administration: Crushed with puree Supervision: Full supervision/cueing for compensatory strategies Compensations: Small sips/bites;Follow solids with liquid Postural Changes: Seated upright at 90 degrees;Remain upright for at least 30 minutes after po intake    Other  Recommendations Oral Care Recommendations: Oral care BID   Follow up Recommendations  Skilled Nursing facility;24 hour supervision/assistance;Home health SLP (pending general POC)    Frequency and Duration min 2x/week  2 weeks       Prognosis Prognosis for Safe Diet Advancement: Fair Barriers to Reach Goals: Cognitive deficits;Behavior;Severity of deficits      Swallow Study   General Date of Onset: 07/09/15 HPI: 82 yo M with midbrain stroke. I suspect his confusion is multifactorial including change in environement(this alone has been enough to cause him to be confused in the past), new stroke, and UTI.  Type of Study: Bedside Swallow Evaluation Previous Swallow Assessment: none known Diet Prior to this Study: Dysphagia 3 (soft);Thin liquids Temperature Spikes Noted: No Respiratory Status: Room air History of Recent Intubation: No Behavior/Cognition: Lethargic/Drowsy;Doesn't follow directions Oral Cavity Assessment: Within Functional Limits Oral Care Completed by SLP: No Oral Cavity - Dentition: Edentulous Self-Feeding Abilities: Total assist Patient Positioning: Upright in bed Baseline Vocal Quality: Normal Volitional Cough: Cognitively unable to elicit Volitional Swallow: Unable to elicit    Oral/Motor/Sensory Function Overall Oral Motor/Sensory Function:  (Difficult to assess)   Ice Chips Ice chips: Within functional limits Presentation: Spoon   Thin Liquid Thin Liquid: Impaired Presentation: Straw Oral Phase Impairments:  (  Unable to  suck, only blew into straw) Pharyngeal  Phase Impairments: Multiple swallows;Suspected delayed Swallow (audible swallow)    Nectar Thick Nectar Thick Liquid: Not tested   Honey Thick Honey Thick Liquid: Not tested   Puree Puree: Impaired Presentation: Spoon Oral Phase Functional Implications: Oral holding   Solid   GO   Solid: Not tested        Rocky Crafts MA, CCC-SLP Pager [phone] 07/11/2015,9:11 AM

## 2015-07-11 NOTE — Progress Notes (Addendum)
PROGRESS NOTE  Roy Cole  WUJ:[id number] DOB: 1923/05/11  DOA: 07/09/2015 PCP: No primary care provider on file.  Outpatient Specialists:  None  Brief Narrative:  82 y.o. male, married, lives with spouse, independent of activities of daily living, has not seen a physician in decades, PMH of anemia, reported dementia, tobacco abuse, presented to [facility] ED on 07/09/15 with complaints of confusion, possible fall with small skin tears, hypoglycemia. Evaluation in ED revealed acute on chronic kidney disease, UTI, disconjugate gaze/rule out stroke. Admitted to telemetry. Neurology consulted.   Assessment & Plan:   Principal Problem:   UTI (lower urinary tract infection) Active Problems:   Acute encephalopathy   Dehydration   Renal failure, acute on chronic (HCC)   Anemia   Dementia   Tobacco abuse   Hypoglycemia   Protein-calorie malnutrition, severe   Cerebral thrombosis with cerebral infarction   UTI - Continue IV Rocephin pending urine culture results. Blood cultures 2: Negative to date.  Dehydration - IV fluids. Improved.   Acute encephalopathy - Secondary to acute medical illness (UTI, dehydration, acute kidney injury, stroke) complicating underlying dementia - Treat underlying cause and monitor.  - Ongoing intermittent acute agitation. Received a dose of Ativan overnight. - ? Persistent mental status changes secondary to degree of irreversible injury/prolonged hypoglycemia.  Acute on chronic kidney disease - Baseline creatinine not known. Presented with creatinine of 1.7. - Hydrated with IV fluids and follow BMP. - Renal ultrasound: No right-sided hydronephrosis. Left kidney not visualized. - Creatinine has plateaued in the 1.5 range-probably his baseline.  Anemia, chronic, possibly chronic disease versus iron deficiency - Anemia panel results as below. Hemoglobin stable.  Dementia with behavioral disturbance - As per spouse, felt to have dementia for the last year and  gradually declining. - Mental status worse than baseline. Normally he is calm. - B-12: 398. TSH 1.952. - Periodic agitation. Use Haldol when necessary rather than benzodiazepines. Monitor. - Discussed at length with patient's spouse at bedside on 5/7. Palliative care consulted for goals of care.  Acute left brain stroke/Disconjugate gaze - Neurology was consulted and recommended MRI brain - MRI: Acute, nonhemorrhagic infarct in the left paramedian midbrain. - Carotid Dopplers: 1-39 percent ICA stenosis bilaterally. Vertebral arteries are patent with antegrade flow. - LDL: 59. - Hemoglobin A1c: Pending. - 2-D echo: LVEF 55-60 percent and grade 1 diastolic dysfunction. - Complete stroke workup: 2-D echo, carotid Dopplers, lipids. Follow A1c. - Patient may not be able to cooperate for MRA brain. Neurology to advise if needs to do MRA under sedation versus TCD's. - Start aspirin. Started on dysphagia 1 diet and thin liquids per speech therapy recommendations. - Sinus rhythm on telemetry.  Hypoglycemia, recurrent - ? Secondary to poor oral intake.Encourage oral intake. Monitor - During next episode of low CBG, will check simultaneous blood glucose to make sure this is accurate. - Ongoing poor oral intake. Change IV fluids to D10.? Related to renal insufficiency.  Tobacco abuse - Nicotine patch  Possible fall at home with skin tears - PT and OT evaluation.  Hypertension - Allow for permissive hypertension given recent acute stroke.  Right kidney superior pole lesion, seen on renal ultrasound - Elective outpatient workup as deemed necessary.  SEVERE MALNUTRITION in the context of Chronic illness as evidenced by Severe muscle/fat loss -Management per dietitian  DVT prophylaxis: Lovenox Code Status: Full Family Communication: Discussed with spouse in detail, updated care and answered questions. Disposition Plan: Probably will need SNF when medically stable. Palliative care consulted  for goals of care.   Consultants:   Neurology  Palliative care team  Procedures:   None  Antimicrobials:   IV Rocephin 5/5 >    Subjective: Received a dose of Ativan overnight for agitation. Somnolent this morning but arousable-not coherent and does not follow instructions  Objective:  Filed Vitals:   07/10/15 0527 07/10/15 1524 07/10/15 2316 07/11/15 0625  BP: 176/94 146/88 136/90 136/68  Pulse: 78 73 66 70  Temp: 98.7 F (37.1 C) 98.9 F (37.2 C) 98.2 F (36.8 C) 98.2 F (36.8 C)  TempSrc:  Oral Oral Oral  Resp: Height:      Weight:      SpO2: 45% 100% 94% 94%    Intake/Output Summary (Last 24 hours) at 07/11/15 1355 Last data filed at 07/11/15 0056  Gross per 24 hour  Intake 643.33 ml  Output      0 ml  Net 643.33 ml   Filed Weights   07/09/15 1440  Weight: 54.432 kg (120 lb)    Examination:  General exam: Moderately built, frail, cachectic, elderly male, chronically ill looking, lying comfortably supine in bed. Appears calm and comfortable  Respiratory system: Clear to auscultation/Poor inspiratory effort.  Cardiovascular system: S1 & S2 heard, RRR.Marland Kitchen No JVD, murmurs, rubs, gallops or clicks. No pedal edema. Telemetry: Sinus rhythm. Gastrointestinal system: Abdomen is nondistended, soft and nontender. No organomegaly or masses felt. Normal bowel sounds heard. Central nervous system: Somnolent but easily arousable and not oriented.  Disconjugate eye movements-seems slightly better compared to admission. Extremities: Symmetric 5 x 5 power. Mittens over bilateral hands. Skin: Superficial skin tears over right scapula, right hip and a larger skin tear over right mid forearm. No other acute findings noted. Psychiatry:  Unable to assess due to confusion.     Data Reviewed: I have personally reviewed following labs and imaging studies  CBC:  Recent Labs Lab 07/09/15 1523 07/10/15 0335  WBC 9.3 9.4  HGB 10.9* 10.3*  HCT 33.6* 32.7*    MCV 87.3 87.2  PLT 167 170   Basic Metabolic Panel:  Recent Labs Lab 07/09/15 1523 07/10/15 0335 07/11/15 0608  NA 142 140 137  K 4.2 4.1 4.0  CL 110 109 105  CO2 22 20* 20*  GLUCOSE 95 108* 135*  BUN CREATININE 1.71* 1.53* 1.52*  CALCIUM 8.8* 8.5* 8.3*   GFR: Estimated Creatinine Clearance: 25.4 mL/min (by C-G formula based on Cr of 1.52). Liver Function Tests:  Recent Labs Lab 07/09/15 1523  AST 24  ALT 17  ALKPHOS 59  BILITOT 0.8  PROT 7.0  ALBUMIN 3.5   No results for input(s): LIPASE, AMYLASE in the last 168 hours. No results for input(s): AMMONIA in the last 168 hours. Coagulation Profile: No results for input(s): INR, PROTIME in the last 168 hours. Cardiac Enzymes: No results for input(s): CKTOTAL, CKMB, CKMBINDEX, TROPONINI in the last 168 hours. BNP (last 3 results) No results for input(s): PROBNP in the last 8760 hours. HbA1C: No results for input(s): HGBA1C in the last 72 hours. CBG:  Recent Labs Lab 07/11/15 0451 07/11/15 0537 07/11/15 0816 07/11/15 1228 07/11/15 1301  GLUCAP 61* 139* 91 61* 65   Lipid Profile:  Recent Labs  07/10/15 1213  CHOL 129  HDL 54  LDLCALC 59  TRIG 80  CHOLHDL 2.4   Thyroid Function Tests:  Recent Labs  07/09/15 2228  TSH 1.952   Anemia Panel:  Recent Labs  07/09/15  2228  VITAMINB12 398  FOLATE 14.2  FERRITIN 503*  TIBC 245*  IRON 20*  RETICCTPCT 1.3   Urine analysis:    Component Value Date/Time   COLORURINE YELLOW 07/09/2015 1527   APPEARANCEUR CLOUDY* 07/09/2015 1527   LABSPEC 1.012 07/09/2015 1527   PHURINE 5.5 07/09/2015 1527   GLUCOSEU NEGATIVE 07/09/2015 1527   HGBUR NEGATIVE 07/09/2015 1527   BILIRUBINUR NEGATIVE 07/09/2015 1527   KETONESUR NEGATIVE 07/09/2015 1527   PROTEINUR NEGATIVE 07/09/2015 1527   NITRITE POSITIVE* 07/09/2015 1527   LEUKOCYTESUR SMALL* 07/09/2015 1527   Sepsis Labs: @LABRCNTIP (procalcitonin:4,lacticidven:4)  ) Recent Results (from the  past 240 hour(s))  Blood culture (routine x 2)     Status: None (Preliminary result)   Collection Time: 07/09/15  3:23 PM  Result Value Ref Range Status   Specimen Description BLOOD LEFT FOREARM  Final   Special Requests BOTTLES DRAWN AEROBIC AND ANAEROBIC 5CC  Final   Culture NO GROWTH < 24 HOURS  Final   Report Status PENDING  Incomplete  Urine culture     Status: None (Preliminary result)   Collection Time: 07/09/15  3:27 PM  Result Value Ref Range Status   Specimen Description URINE, CLEAN CATCH  Final   Special Requests Normal  Final   Culture TOO YOUNG TO READ  Final   Report Status PENDING  Incomplete  Blood culture (routine x 2)     Status: None (Preliminary result)   Collection Time: 07/09/15  5:20 PM  Result Value Ref Range Status   Specimen Description BLOOD LEFT WRIST  Final   Special Requests BOTTLES DRAWN AEROBIC AND ANAEROBIC 5CC  Final   Culture NO GROWTH < 24 HOURS  Final   Report Status PENDING  Incomplete         Radiology Studies: Dg Chest 2 View  07/09/2015  CLINICAL DATA:  Weakness and altered behavior.  Questionable fall EXAM: CHEST  2 VIEW COMPARISON:  None. FINDINGS: There is scarring in the left base. There is no edema or consolidation. Heart size and pulmonary vascularity are normal. No adenopathy. No pneumothorax. No acute fracture evident. IMPRESSION: Scarring left base. No edema or consolidation. No apparent pneumothorax. Electronically Signed   By: Bretta BangWilliam  Woodruff III M.D.   On: 07/09/2015 15:56   Dg Lumbar Spine Complete  07/09/2015  CLINICAL DATA:  Pain following fall EXAM: LUMBAR SPINE - COMPLETE 4+ VIEW COMPARISON:  None. FINDINGS: Frontal, lateral, spot lumbosacral lateral, and bilateral oblique views were obtained. There are 5 non-rib-bearing lumbar type vertebral bodies. There is lumbar dextroscoliosis with rotatory component. There is anterior wedging of the L1 and L2 vertebral bodies, age uncertain. No other fractures are identified. There is  no spondylolisthesis. There is moderate disc space narrowing at L2-3 and L3-4. There is facet osteoarthritic change at L3-4, L4-5, and L5-S1 bilaterally. IMPRESSION: Age uncertain anterior wedging of the L1 and L2 vertebral bodies. Scoliosis with osteoarthritic change at several levels. No spondylolisthesis. Electronically Signed   By: Bretta BangWilliam  Woodruff III M.D.   On: 07/09/2015 15:58   Ct Head Wo Contrast  07/09/2015  CLINICAL DATA:  Altered mental status with fall EXAM: CT HEAD WITHOUT CONTRAST CT CERVICAL SPINE WITHOUT CONTRAST TECHNIQUE: Multidetector CT imaging of the head and cervical spine was performed following the standard protocol without intravenous contrast. Multiplanar CT image reconstructions of the cervical spine were also generated. COMPARISON:  None. FINDINGS: CT HEAD FINDINGS There is mild generalized atrophy. There is no intracranial mass, hemorrhage, extra-axial fluid collection, or  midline shift. There is patchy small vessel disease throughout the centra semiovale bilaterally. Elsewhere gray-white compartments appear normal. No acute infarct is evident. The bony calvarium appears intact. The mastoid air cells are clear. No intraorbital lesions are evident. Paranasal sinuses are clear. CT CERVICAL SPINE FINDINGS There is no acute fracture. There is 4 mm of retrolisthesis of C5 on C6. There is minimal retrolisthesis of C6 on C7. There is no other spondylolisthesis. Prevertebral soft tissues and predental space regions are normal. There is marked bony overgrowth along the anterior arch of C2 on the left, consistent with advanced osteoarthritis and possibly representative of residua from prior trauma. No acute lesion is seen in this area. There is moderate facet hypertrophy at multiple levels bilaterally. There is exit foraminal narrowing due to bony hypertrophy at C3-4 on the right, at C4-5 bilaterally, at C5-6 bilaterally, and at C6-7 on the right due to bony hypertrophy. There is bullous  disease in the lung apices bilaterally. There are foci of carotid artery calcification bilaterally. IMPRESSION: CT head: Atrophy with periventricular small vessel disease. No intracranial mass, hemorrhage, or extra-axial fluid collection. No acute infarct evident. CT cervical spine: No acute fracture. Areas of spondylolisthesis at C5-6 and C6-7 are felt to be due to underlying spondylolisthesis. Question old trauma anterior aspect of C2 on the left. Multifocal arthropathy with exit foraminal narrowing at multiple levels. Foci of carotid artery calcification bilaterally noted. Bullous disease noted in each upper lobe/apex region. Electronically Signed   By: Bretta Bang III M.D.   On: 07/09/2015 16:53   Ct Cervical Spine Wo Contrast  07/09/2015  CLINICAL DATA:  Altered mental status with fall EXAM: CT HEAD WITHOUT CONTRAST CT CERVICAL SPINE WITHOUT CONTRAST TECHNIQUE: Multidetector CT imaging of the head and cervical spine was performed following the standard protocol without intravenous contrast. Multiplanar CT image reconstructions of the cervical spine were also generated. COMPARISON:  None. FINDINGS: CT HEAD FINDINGS There is mild generalized atrophy. There is no intracranial mass, hemorrhage, extra-axial fluid collection, or midline shift. There is patchy small vessel disease throughout the centra semiovale bilaterally. Elsewhere gray-white compartments appear normal. No acute infarct is evident. The bony calvarium appears intact. The mastoid air cells are clear. No intraorbital lesions are evident. Paranasal sinuses are clear. CT CERVICAL SPINE FINDINGS There is no acute fracture. There is 4 mm of retrolisthesis of C5 on C6. There is minimal retrolisthesis of C6 on C7. There is no other spondylolisthesis. Prevertebral soft tissues and predental space regions are normal. There is marked bony overgrowth along the anterior arch of C2 on the left, consistent with advanced osteoarthritis and possibly  representative of residua from prior trauma. No acute lesion is seen in this area. There is moderate facet hypertrophy at multiple levels bilaterally. There is exit foraminal narrowing due to bony hypertrophy at C3-4 on the right, at C4-5 bilaterally, at C5-6 bilaterally, and at C6-7 on the right due to bony hypertrophy. There is bullous disease in the lung apices bilaterally. There are foci of carotid artery calcification bilaterally. IMPRESSION: CT head: Atrophy with periventricular small vessel disease. No intracranial mass, hemorrhage, or extra-axial fluid collection. No acute infarct evident. CT cervical spine: No acute fracture. Areas of spondylolisthesis at C5-6 and C6-7 are felt to be due to underlying spondylolisthesis. Question old trauma anterior aspect of C2 on the left. Multifocal arthropathy with exit foraminal narrowing at multiple levels. Foci of carotid artery calcification bilaterally noted. Bullous disease noted in each upper lobe/apex region. Electronically Signed  By: Bretta Bang III M.D.   On: 07/09/2015 16:53   Mr Brain Wo Contrast  07/10/2015  CLINICAL DATA:  Gaze palsy EXAM: MRI HEAD WITHOUT CONTRAST TECHNIQUE: Multiplanar, multiecho pulse sequences of the brain and surrounding structures were obtained without intravenous contrast. COMPARISON:  Head CT from yesterday FINDINGS: Motion degraded study with late acquired sequences nondiagnostic. Calvarium and upper cervical spine: No focal marrow signal abnormality. Orbits: Negative. Sinuses and Mastoids: Clear. Brain: Linear restricted diffusion left of midline in the midbrain extending from the interpeduncular fossa to cerebral aqua duct. No superimposed hemorrhage. No acute infarct elsewhere. No evidence of major vessel occlusion. There is normal flow related signal loss in the basilar and visible branches. Generalized atrophy. Age congruent small-vessel ischemic gliosis in the periventricular white matter. IMPRESSION: 1. Acute,  nonhemorrhagic infarct in the left paramedian midbrain. 2. Generalized atrophy. 3. Motion degraded study with multiple nondiagnostic sequences. Electronically Signed   By: Marnee Spring M.D.   On: 07/10/2015 11:14   US Renal  07/09/2015  CLINICAL DATA:  Patient with acute renal failure. EXAM: RENAL / URINARY TRACT ULTRASOUND COMPLETE COMPARISON:  None. FINDINGS: Right Kidney: Length: 9.2 cm. Normal renal cortical thickness and echogenicity. No hydronephrosis. Within the superior pole of the right kidney there is a 3.0 x 2.3 x 2.7 cm simple cyst. Additionally within the superior pole there there is an adjacent 2.1 x 1.6 x 2.4 cm hypoechoic lesion. Left Kidney: Not visualized. Bladder: Distended. IMPRESSION: No right-sided hydronephrosis. The left kidney is not able to be visualized. Indeterminate hypoechoic lesion superior pole right kidney. In the nonacute setting, recommend correlation with pre and post contrast-enhanced CT or MRI for definitive characterization and to exclude the possibility of solid mass. Electronically Signed   By: Annia Belt M.D.   On: 07/09/2015 20:44        Scheduled Meds: . aspirin  325 mg Oral Daily  . cefTRIAXone (ROCEPHIN)  IV  1 g Intravenous Q24H  . enoxaparin (LOVENOX) injection  30 mg Subcutaneous Q24H  . feeding supplement (ENSURE ENLIVE)  237 mL Oral BID BM  . multivitamin with minerals  1 tablet Oral Daily  . nicotine  14 mg Transdermal Q2000   Continuous Infusions: . dextrose 5 % and 0.45% NaCl 100 mL/hr at 07/11/15 1007     LOS: 2 days    Time spent: 40 minutes.    [facility], MD [facility] Pager [phone]  If 7PM-7AM, please contact night-coverage [email or web address] Password [email or web address] 07/11/2015, 1:55 PM

## 2015-07-11 NOTE — Progress Notes (Signed)
O2 sat 79% on room air. Placed patient on 6L nasal cannula. Weaned down to 3L, sat 100%. MD notified. Will continue to monitor.

## 2015-07-11 NOTE — Progress Notes (Signed)
Hypoglycemic Event  CBG: 48  Treatment: D50 IV 25 mL  Symptoms: Sweaty/hot  Follow-up CBG: Time: 0130 CBG Result: 127  Possible Reasons for Event: Inadequate meal intake  Comments/MD notified: Burnadette PeterLynch, MD notified  [name], Champayne Kocian

## 2015-07-11 NOTE — Progress Notes (Signed)
  Echocardiogram 2D Echocardiogram has been performed.  Arvil ChacoFoster, Airam Heidecker 07/11/2015, 11:54 AM

## 2015-07-11 NOTE — Progress Notes (Signed)
Hypoglycemic Event  CBG: 48  Treatment: D50 IV 25 mL and STAT BMP   Symptoms: None  Follow-up CBG: Time: 2123 CBG Result: 89  Possible Reasons for Event: Inadequate meal intake  Comments/MD notified: MD notified. Follow-up orders placed. Will continue to monitor.  [name], Evanne Matsunaga

## 2015-07-12 DIAGNOSIS — Z515 Encounter for palliative care: Secondary | ICD-10-CM

## 2015-07-12 LAB — GLUCOSE, CAPILLARY
GLUCOSE-CAPILLARY: 48 mg/dL — AB (ref 65–99)
GLUCOSE-CAPILLARY: 76 mg/dL (ref 65–99)
GLUCOSE-CAPILLARY: 62 mg/dL — AB (ref 65–99)
Glucose-Capillary: 59 mg/dL — ABNORMAL LOW (ref 65–99)
Glucose-Capillary: 62 mg/dL — ABNORMAL LOW (ref 65–99)
Glucose-Capillary: 76 mg/dL (ref 65–99)
Glucose-Capillary: 81 mg/dL (ref 65–99)
Glucose-Capillary: 95 mg/dL (ref 65–99)
Glucose-Capillary: 99 mg/dL (ref 65–99)

## 2015-07-12 LAB — CORTISOL: Cortisol, Plasma: 17.8 ug/dL

## 2015-07-12 LAB — BETA-HYDROXYBUTYRIC ACID: Beta-Hydroxybutyric Acid: <0.05 mmol/L — ABNORMAL LOW (ref 0.05–0.27)

## 2015-07-12 LAB — HEMOGLOBIN A1C
HEMOGLOBIN A1C: 4.9 % (ref 4.8–5.6)
Hgb A1c MFr Bld: 4.9 % (ref 4.8–5.6)
Mean Plasma Glucose: 94 mg/dL
Mean Plasma Glucose: 94 mg/dL

## 2015-07-12 MED ORDER — DEXTROSE 50 % IV SOLN
INTRAVENOUS | Status: AC
  Administered 2015-07-12: 25 mL
  Filled 2015-07-12: qty 50

## 2015-07-12 MED ORDER — THIAMINE HCL 100 MG/ML IJ SOLN
100.0000 mg | Freq: Every day | INTRAMUSCULAR | Status: DC
  Administered 2015-07-12: 100 mg via INTRAVENOUS
  Filled 2015-07-12: qty 2

## 2015-07-12 MED ORDER — DEXTROSE 10 % IV SOLN
INTRAVENOUS | Status: DC
Start: 2015-07-12 — End: 2015-07-14
  Administered 2015-07-12: 75 mL/h via INTRAVENOUS
  Administered 2015-07-13 – 2015-07-14 (×2): via INTRAVENOUS

## 2015-07-12 MED ORDER — GLUCOSE 40 % PO GEL
ORAL | Status: AC
  Administered 2015-07-12: 37.5 g
  Filled 2015-07-12: qty 1

## 2015-07-12 MED ORDER — POTASSIUM CHLORIDE CRYS ER 20 MEQ PO TBCR
40.0000 meq | EXTENDED_RELEASE_TABLET | Freq: Once | ORAL | Status: AC
  Administered 2015-07-12: 40 meq via ORAL
  Filled 2015-07-12: qty 2

## 2015-07-12 MED ORDER — RESOURCE THICKENUP CLEAR PO POWD
ORAL | Status: DC | PRN
  Filled 2015-07-12: qty 125

## 2015-07-12 NOTE — Clinical Social Work Placement (Signed)
   CLINICAL SOCIAL WORK PLACEMENT  NOTE  Date:  07/12/2015  Patient Details  Name: Roy Cole MRN: [id number] Date of Birth: 04/17/1923  Clinical Social Work is seeking post-discharge placement for this patient at the Skilled  Nursing Facility level of care (*CSW will initial, date and re-position this form in  chart as items are completed):      Patient/family provided with [facility] Clinical [facility] Work Department's list of facilities offering this level of care within the geographic area requested by the patient (or if unable, by the patient's family).      Patient/family informed of their freedom to choose among providers that offer the needed level of care, that participate in Medicare, Medicaid or managed care program needed by the patient, have an available bed and are willing to accept the patient.      Patient/family informed of [facility]'s ownership interest in [facility] and [facility], as well as of the fact that they are under no obligation to receive care at these facilities.  PASRR submitted to EDS on 07/12/15     PASRR number received on 07/12/15     Existing PASRR number confirmed on       FL2 transmitted to all facilities in geographic area requested by pt/family on 07/12/15     FL2 transmitted to all facilities within larger geographic area on       Patient informed that his/her managed care company has contracts with or will negotiate with certain facilities, including the following:            Patient/family informed of bed offers received.  Patient chooses bed at       Physician recommends and patient chooses bed at      Patient to be transferred to   on  .  Patient to be transferred to facility by       Patient family notified on   of transfer.  Name of family member notified:        PHYSICIAN Please sign FL2     Additional Comment:    _______________________________________________ Mearl LatinNadia S Ian Castagna, LCSWA 07/12/2015, 5:21 PM

## 2015-07-12 NOTE — Progress Notes (Signed)
Speech Language Pathology Treatment: Dysphagia  Patient Details Name: Roy Cole MRN: [id number] DOB: 1923-08-12 Today's Date: 07/12/2015 Time: [id number] SLP Time Calculation (min) (ACUTE ONLY): 13 min  Assessment / Plan / Recommendation Clinical Impression  Pt demonstrates ongoing restlessness with poor attention to PO. SLP again used pipette to give small of amount sof PO and facilitate sipping with a straw. No signs of aspiration observed, but there is potential risk of silent aspiration with increased probability with risky feeding and poor awareness of PO.  Pt would not be capable to MBS due to mentation. Recommend pt downgrade to nectar thick liquids to reduce potential risk of aspiration with ongoing pureed solids. Will follow for tolerance and potential for upgrade.    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 I suspect his confusion is multifactorial including change in environement(this alone has been enough to cause him to be confused in the past), new stroke, and UTI.       SLP Plan  Continue with current plan of care     Recommendations  Diet recommendations: Dysphagia 1 (puree);Nectar-thick liquid Liquids provided via: Straw;Cup Medication Administration: Crushed with puree Supervision: Full supervision/cueing for compensatory strategies;Staff to assist with self feeding Compensations: Slow rate;Small sips/bites Postural Changes and/or Swallow Maneuvers: Seated upright 90 degrees             Plan: Continue with current plan of care     GO               [facility] Center[facility]nie Kearston Putman, MA CCC-SLP [phone]  Claudine MoutonDeBlois, Maguire Sime Caroline 07/12/2015, 2:47 PM

## 2015-07-12 NOTE — Clinical Social Work Note (Signed)
Clinical Social Work Assessment  Patient Details  Name: Roy Cole MRN: [id number] Date of Birth: [birth year]  Date of referral:  07/12/15               Reason for consult:  Facility Placement                Permission sought to share information with:  Family Supports Permission granted to share information::  No (pt DO)  Name::     Retail buyerBrenda  Agency::  SNFs  Relationship::  wife  Contact Information:     Housing/Transportation Living arrangements for the past 2 months:  Single Family Home Source of Information:  Spouse Patient Interpreter Needed:  None Criminal Activity/Legal Involvement Pertinent to Current Situation/Hospitalization:  No - Comment as needed Significant Relationships:  Adult Children, Spouse Lives with:  Adult Children, Spouse Do you feel safe going back to the place where you live?  No Need for family participation in patient care:  Yes (Comment) (decision making)  Care giving concerns:  Pt lives at home with wife and with son who works during the day- pt now impaired after stroke and would not have enough support at home.   Social Worker assessment / plan:  CSW spoke with pt wife at bedside about PT recommendation for SNF.  Pt wife states that pt has been healthy most his life till last few years with his dementia so he has never been in SNF before- CSW explained SNF and the referral process.  Employment status:  Retired Database administratornsurance information:  Managed Medicare PT Recommendations:  Skilled Nursing Facility Information / Referral to community resources:  Skilled Nursing Facility  Patient/Family's Response to care:  Wife agreeable to short term stay at SNF- understands she would not be able to care for pt at home with current mobility and wants what is best for pt in his recovery.  Patient/Family's Understanding of and Emotional Response to Diagnosis, Current Treatment, and Prognosis:  Wife is very hopeful for recovery- wants pt to come home as soon as he is  well enough.  States that he was a good husband, provider, father and that he deserves the best care.  Emotional Assessment Appearance:  Appears stated age Attitude/Demeanor/Rapport:    Affect (typically observed):  Agitated Orientation:  Fluctuating Orientation (Suspected and/or reported Sundowners) Alcohol / Substance use:  Not Applicable Psych involvement (Current and /or in the community):  No (Comment)  Discharge Needs  Concerns to be addressed:  Care Coordination Readmission within the last 30 days:  No Current discharge risk:  Physical Impairment Barriers to Discharge:  Continued Medical Work up   Roy Cole, Roy Cochrane M, LCSW 07/12/2015, 5:23 PM

## 2015-07-12 NOTE — NC FL2 (Signed)
[place] MEDICAID FL2 LEVEL OF CARE SCREENING TOOL     IDENTIFICATION  Patient Name: Roy Cole Birthdate: 09/09/1923 Sex: male Admission Date (Current Location): 07/09/2015  [facility] Series and [place] Number:  Producer, television/film/video and Address:  [facility] [place], [address], [place], [place] [address]      Provider Number: [id number]  Attending Physician Name and Address:  Elease Etienne, MD  Relative Name and Phone Number:  Steward Drone spouse, [phone]    Current Level of Care: Hospital Recommended Level of Care: Skilled Nursing Facility Prior Approval Number:    Date Approved/Denied:   PASRR Number: [id number]  Discharge Plan: SNF    Current Diagnoses: Patient Active Problem List   Diagnosis Date Noted  . Protein-calorie malnutrition, severe 07/11/2015  . Cerebral thrombosis with cerebral infarction 07/11/2015  . UTI (lower urinary tract infection) 07/09/2015  . Acute encephalopathy 07/09/2015  . Dehydration 07/09/2015  . Renal failure, acute on chronic (HCC) 07/09/2015  . Anemia 07/09/2015  . Dementia 07/09/2015  . Tobacco abuse 07/09/2015  . Hypoglycemia 07/09/2015    Orientation RESPIRATION BLADDER Height & Weight     Self  O2 (Nasal cannula 4L/min) Incontinent, External catheter (Condom catheter) Weight: 120 lb (54.432 kg) Height:   (172.7 cm)  BEHAVIORAL SYMPTOMS/MOOD NEUROLOGICAL BOWEL NUTRITION STATUS      Incontinent Diet (Please see DC summary)  AMBULATORY STATUS COMMUNICATION OF NEEDS Skin   Extensive Assist Verbally Normal                       Personal Care Assistance Level of Assistance  Bathing, Feeding, Dressing Bathing Assistance: Maximum assistance Feeding assistance: Maximum assistance Dressing Assistance: Maximum assistance     Functional Limitations Info             SPECIAL CARE FACTORS FREQUENCY  PT (By licensed PT)     PT Frequency: min 3x/week              Contractures       Additional Factors Info  Code Status, Allergies Code Status Info: Full Allergies Info: NKA           Current Medications (07/12/2015):  This is the current hospital active medication list Current Facility-Administered Medications  Medication Dose Route Frequency Provider Last Rate Last Dose  . acetaminophen (TYLENOL) tablet 650 mg  650 mg Oral Q6H PRN Elease Etienne, MD       Or  . acetaminophen (TYLENOL) suppository 650 mg  650 mg Rectal Q6H PRN Elease Etienne, MD      . albuterol (PROVENTIL) (2.5 MG/3ML) 0.083% nebulizer solution 2.5 mg  2.5 mg Nebulization Q2H PRN Elease Etienne, MD      . aspirin tablet 325 mg  325 mg Oral Daily Elease Etienne, MD   325 mg at 07/12/15 0853  . cefTRIAXone (ROCEPHIN) 1 g in dextrose 5 % 50 mL IVPB  1 g Intravenous Q24H Elease Etienne, MD   1 g at 07/11/15 1509  . dextrose 10 % infusion   Intravenous Continuous Elease Etienne, MD      . enoxaparin (LOVENOX) injection 30 mg  30 mg Subcutaneous Q24H Elease Etienne, MD   30 mg at 07/11/15 2009  . feeding supplement (ENSURE ENLIVE) (ENSURE ENLIVE) liquid 237 mL  237 mL Oral BID BM Elease Etienne, MD   237 mL at 07/12/15 1000  . haloperidol lactate (HALDOL) injection 1 mg  1 mg Intravenous Q12H PRN Elease EtienneAnand D Hongalgi, MD   1 mg at 07/12/15 [id number]  . multivitamin with minerals tablet 1 tablet  1 tablet Oral Daily Elease EtienneAnand D Hongalgi, MD   1 tablet at 07/12/15 0854  . nicotine (NICODERM CQ - dosed in mg/24 hours) patch 14 mg  14 mg Transdermal Q2000 Elease EtienneAnand D Hongalgi, MD   14 mg at 07/11/15 2008  . potassium chloride SA (K-DUR,KLOR-CON) CR tablet 40 mEq  40 mEq Oral Once Elease EtienneAnand D Hongalgi, MD      . RESOURCE THICKENUP CLEAR   Oral PRN Elease EtienneAnand D Hongalgi, MD      . thiamine (B-1) injection 100 mg  100 mg Intravenous Daily Elease EtienneAnand D Hongalgi, MD         Discharge Medications: Please see discharge summary for a list of discharge medications.  Relevant Imaging Results:  Relevant Lab  Results:   Additional Information SSN: [id number]  Sariyah Corcino S HudsonRayyan, [place]CSWA

## 2015-07-12 NOTE — Progress Notes (Signed)
STROKE TEAM PROGRESS NOTE   SUBJECTIVE (INTERVAL HISTORY) Sitting up in chair. No complaints. Remains confused. Mittens on.    OBJECTIVE Temp:  [98.4 F (36.9 C)-98.5 F (36.9 C)] 98.5 F (36.9 C) (05/07 2127) Pulse Rate:  [70-97] 97 (05/08 0805) Cardiac Rhythm:  [-] Normal sinus rhythm (05/07 1900) Resp:  [16-20] 20 (05/07 2134) BP: (129-196)/(80-113) 129/113 mmHg (05/08 0805) SpO2:  [79 %-100 %] 100 % (05/08 0805)  CBC:   Recent Labs Lab 07/10/15 0335 07/11/15 2143  WBC 9.4 8.4  HGB 10.3* 10.1*  HCT 32.7* 30.8*  MCV 87.2 85.1  PLT 170 156    Basic Metabolic Panel:   Recent Labs Lab 07/11/15 2038 07/11/15 2143  NA 135 133*  K 3.7 3.4*  CL 103 101  CO2 22 23  GLUCOSE 87 72  BUN 13 13  CREATININE 1.50* 1.46*  CALCIUM 8.4* 8.1*    Lipid Panel:     Component Value Date/Time   CHOL 129 07/10/2015 1213   TRIG 80 07/10/2015 1213   HDL 54 07/10/2015 1213   CHOLHDL 2.4 07/10/2015 1213   VLDL 16 07/10/2015 1213   LDLCALC 59 07/10/2015 1213     IMAGING  Dg Chest 2 View 07/09/2015   Scarring left base. No edema or consolidation. No apparent pneumothorax.   Dg Lumbar Spine Complete 07/09/2015   Age uncertain anterior wedging of the L1 and L2 vertebral bodies. Scoliosis with osteoarthritic change at several levels. No spondylolisthesis.   Ct Head Wo Contrast 07/09/2015   Atrophy with periventricular small vessel disease. No intracranial mass, hemorrhage, or extra-axial fluid collection. No acute infarct evident.   Ct Cervical Spine Wo Contrast 07/09/2015   No acute fracture. Areas of spondylolisthesis at C5-6 and C6-7 are felt to be due to underlying spondylolisthesis. Question old trauma anterior aspect of C2 on the left. Multifocal arthropathy with exit foraminal narrowing at multiple levels. Foci of carotid artery calcification bilaterally noted. Bullous disease noted in each upper lobe/apex region.   Mr Brain Wo Contrast 07/10/2015   1. Acute, nonhemorrhagic  infarct in the left paramedian midbrain.  2. Generalized atrophy.  3. Motion degraded study with multiple nondiagnostic sequences.   US Renal 07/09/2015   No right-sided hydronephrosis.  The left kidney is not able to be visualized.  Indeterminate hypoechoic lesion superior pole right kidney.  In the nonacute setting, recommend correlation with pre and post contrast-enhanced CT or MRI for definitive characterization and to exclude the possibility of solid mass.   CUS - Bilateral: 1-39% ICA stenosis. Vertebral artery flow is antegrade.  TTE - - Left ventricle: The cavity size was normal. Wall thickness was  normal. Systolic function was normal. The estimated ejection  fraction was in the range of 55% to 60%. Regional wall motion  abnormalities cannot be excluded. Doppler parameters are  consistent with abnormal left ventricular relaxation (grade 1  diastolic dysfunction). Impressions: - Technically difficult; normal LV systolic function; cannot R/O  wall motion abnormality; grade 1 diastolic dysfunction;;  sclerotic aortic valve; mild TR.   PHYSICAL EXAM Frail elderly african [place] male not in distress. . Afebrile. Head is nontraumatic. Neck is supple without bruit.  Cardiac exam no murmur or gallop. Lungs are clear to auscultation. Distal pulses are well felt.  Neurological Exam :  Awake alert but not orientated to time place or people. Severely dysarthric and cannot be easily understood. Follows only midline occasional commands. Naming 0/2 and able to repeat one sentence. Pupils 3 mm reactive. Left  eye with medial gaze deficit. Left CNVII partial peripheral palsy, not able to close left eye tight and left nasolabial fold flattening. Tongue midline. Moves all 4 limbs purposefully to pain, BUE 5/5 and LLE 4/5 and RLE 5/5. Plantars downgoing.   ASSESSMENT/PLAN Mr. Roy Cole is a 82 y.o. male with history of severe dementia, ongoing tobacco use, and anemia presenting with  disconjugate gaze. He did not receive IV t-PA due to unknown time of onset and minimal deficits.  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   MRA  not performed  Carotid Doppler unremarkable  2D Echo - EF 55-60%. No cardiac source of emboli identified.  LDL - 59, at goal < 70  HgbA1c 4.9  VTE prophylaxis - Lovenox DIET - DYS 1 Room service appropriate?: Yes; Fluid consistency:: Thin  No antithrombotic prior to admission, now on aspirin 325 mg daily. Continue ASA on discharge.  Ongoing aggressive stroke risk factor management  Therapy recommendations: Pending  Disposition: Pending  Stroke team will sign off  Please call for questions. Needs.   Follow up Dr. Pearlean BrownieSethi in 2 months.  Hypertension  Stable  Permissive hypertension (OK if < 220/120) but gradually normalize in 5-7 days  Other Stroke Risk Factors  Advanced age  Cigarette smoker,  advised to stop smoking  Other Active Problems  Baseline dementia, with behavioral disturbance in hospital  UTI - day #4 IV Rocephin  Dehydration, resolved  Anemia, chronic disease vs Fe deficiency  Acute on chronic kidney disease  R kidney mass on renal ultrasound  Desaturation - 79% on RA  Hypoglycemia, recurrent. tx with D50. Poor po intake  Possible fall at home with skin tears  Severe malnutrition. Body mass index is 18.25 kg/(m^2).   Hospital day # 3  Neurology will sign off. Please call with questions. Pt [facility]. Thanks for the consult.  Marvel PlanJindong Tallin Hart, MD PhD Stroke Neurology 07/12/2015 2:53 PM   To contact Stroke Continuity provider, please refer to [email or web address]. After hours, contact General Neurology

## 2015-07-12 NOTE — Progress Notes (Signed)
Physical Therapy Treatment Patient Details Name: Roy Recordsnderson Loomis MRN: [id number] DOB: 08/29/1923 Today's Date: 07/12/2015    History of Present Illness Roy Cole is a 82 y.o. male, lives with spouse, independent of activities of daily living, PMH of anemia, reported dementia, tobacco abuse, [facility]15 with complaints of confusion.  Patient's spouse and 2 sons at bedside provided history. At baseline, patient apparently has memory deficits and intermittent confusion, gradually declining over the last 1 year, independent of activities of daily living and ambulates at home without help of assistance. MRI infarct in the left paramedian midbrain    PT Comments    Patient continues to be restless and difficult to redirect at times. Easily distracted. Requires assist of 2 for safety during mobility due to imbalance, visual deficits (diplopia), decreased awareness and unpredictability. Pt follows 1 step commands inconsistently. Speech difficult to comprehend. Per wife, pt is not close to baseline with regards to mentation and confusion. Tolerated short distance ambulation with Mod A of 2. Pt would benefit from ST SNF. Wife in agreement. Will continue to follow acutely.  Follow Up Recommendations  SNF     Equipment Recommendations  None recommended by PT    Recommendations for Other Services       Precautions / Restrictions Precautions Precautions: Fall Precaution Comments: dementia Restrictions Weight Bearing Restrictions: No    Mobility  Bed Mobility Overal bed mobility: Needs Assistance Bed Mobility: Supine to Sit     Supine to sit: Min assist;HOB elevated     General bed mobility comments: followed vc to sit at EOB; assist with trunk.  Transfers Overall transfer level: Needs assistance Equipment used: 2 person hand held assist;1 person hand held assist Transfers: Sit to/from Stand Sit to Stand: Mod assist;+2 physical assistance;From elevated surface          General transfer comment: unsteady and confusion noted; assist for balance. Stood from EOB and chair x3. Max directional cues- resisting at times due to confusion and having a different agenda.  Ambulation/Gait Ambulation/Gait assistance: Mod assist;+2 physical assistance Ambulation Distance (Feet): 12 Feet Assistive device: 2 person hand held assist Gait Pattern/deviations: Step-through pattern;Scissoring;Decreased stride length;Staggering right;Staggering left;Narrow base of support;Drifts right/left Gait velocity: slow   General Gait Details: very unsteady and needing max directional cues to get to the other side of the bed; scissoring and pt bending over to hold onto bed at times unexpectedly. Increased knee flexion.  Restless.    Stairs            Wheelchair Mobility    Modified Rankin (Stroke Patients Only) Modified Rankin (Stroke Patients Only) Pre-Morbid Rankin Score: No symptoms Modified Rankin: Moderately severe disability     Balance Overall balance assessment: Needs assistance Sitting-balance support: Feet supported;No upper extremity supported Sitting balance-Leahy Scale: Fair Sitting balance - Comments: Able to reach outside BoS to floor to try to grab shoe but overshooting- question diplopia?   Standing balance support: During functional activity;Single extremity supported Standing balance-Leahy Scale: Poor Standing balance comment: Requires UE support for static and dynamic standing tasks and 2 person for safety due to unpredictability.                     Cognition Arousal/Alertness: Awake/alert Behavior During Therapy: Restless Overall Cognitive Status: Impaired/Different from baseline Area of Impairment: Safety/judgement;Awareness;Attention;Orientation;Following commands;Problem solving   Current Attention Level: Sustained   Following Commands: Follows one step commands inconsistently Safety/Judgement: Decreased awareness of  deficits;Decreased awareness of safety  Problem Solving: Requires verbal cues;Requires tactile cues General Comments: restless in bed. Speech was unintelligible most of the time. Difficult to redirect. Distracted.     Exercises      General Comments General comments (skin integrity, edema, etc.): Dysconjugate gaze still present. not able to state colors of anything or objects.      Pertinent Vitals/Pain Pain Assessment: Faces Faces Pain Scale: No hurt    Home Living                      Prior Function            PT Goals (current goals can now be found in the care plan section) Progress towards PT goals: Progressing toward goals (slowly)    Frequency  Min 3X/week    PT Plan Current plan remains appropriate;Frequency needs to be updated    Co-evaluation PT/OT/SLP Co-Evaluation/Treatment: Yes Reason for Co-Treatment: For patient/therapist safety;Complexity of the patient's impairments (multi-system involvement) PT goals addressed during session: Mobility/safety with mobility;Strengthening/ROM;Balance       End of Session Equipment Utilized During Treatment: Gait belt Activity Tolerance: Patient tolerated treatment well Patient left: in bed;with call bell/phone within reach;with bed alarm set;with family/visitor present;with restraints reapplied (mitts applied)     Time: 1610-9604 PT Time Calculation (min) (ACUTE ONLY): 45 min  Charges:  $Gait Training: 8-22 mins                    G Codes:      Esequiel Kleinfelter A Shawnda Mauney 07/12/2015, 4:51 PM  Mylo Red, PT, DPT [phone]

## 2015-07-12 NOTE — Consult Note (Signed)
Consultation Note Date: 07/12/2015   Patient Name: Roy Cole  DOB: [birth year]  MRN: [id number]  Age / Sex: 82 y.o., male  PCP: No primary care provider on file. Referring Physician: Modena Jansky, MD  Reason for Consultation: Establishing goals of care  HPI/Patient Profile: 82 y.o. male  with past medical history of anemia, reported dementia, tobacco abuse admitted on 07/09/2015 with confusion. Treatment for UTI, acute ischemic infarct of left midbrain, and delirium on top of progressing dementia.   Clinical Assessment and Goals of Care: I met today with Roy Cole (lethargic and confused when awake so he was unable to participate) along with his wife Roy Cole at bedside. We discussed his status and she tells me that he has exhibited signs of dementia for ~3 years. He was fairly independent at home but did not like to bathe and would often "spruce up" and then put back on his same clothes. She also said that he had trouble expressing himself and what he wanted to say. He has always been thin and a light eater but she says lately she was happy if she was able to get 2 decent meals in him.   Roy Cole acknowledges that he is 82 yo and that she believes these are all signs that his death may be nearing. However, I fear that she is hopeful for more time than he may have. She remains hopeful that he will regain some of his function and even expresses desire for him to walk and walk up their steps at home. I shared that I am concerned with his ability to even accept enough intake to sustain him. I shared my fears and barriers to recovery.   Roy Cole does acknowledge QOL and comfort as priority as "he has always been a good provider, husband, and father - he deserves it." With this being said we discussed code status (she is inclined for NO resuscitation but wishes to further consider) as well as hospice. She sounds open to  hospice helping with their goal of QOL/comfort but wishes to pursue SNF rehab to at least attempt to optimize functional status. When discussing hospice she is surprised that he may be eligible for this now. She says their 1st grandchild will be born in June and she is hoping that he can be a part of this milestone and get joy from this. Emotional support provided. I will follow up tomorrow.   NEXT OF KIN wife Thaddeaus Monica    SUMMARY OF RECOMMENDATIONS   - Wife says QOL and comfort are priority - She is considering code status - Open to hospice but wishes to pursue SNF rehab with hopes of improvement - Will consider meeting with their sons as she says this is very hard on them but she is trying to prepare them  Code Status/Advance Care Planning:  Full code   Symptom Management:   Agitation/delirium: Agree with low dose haldol prn. Continue nicotine patch. May consider low dose Zyprexa if no improvement.   Fall risk: Seems to want  to get out of bed to use bathroom and to smoke.   Palliative Prophylaxis:   Bowel Regimen, Delirium Protocol, Frequent Pain Assessment, Oral Care and Turn Reposition   Psycho-social/Spiritual:   Desire for further Chaplaincy support:yes  Additional Recommendations: Caregiving  Support/Resources and Education on Hospice  Prognosis:   Prognosis very poor with progressing dementia and now stroke with increased confusion, AMS, poor intake. < 6 months and I fear prognosis could be much less.   Discharge Planning: [place] for rehab with Palliative care service follow-up      Primary Diagnoses: Present on Admission:  . UTI (lower urinary tract infection) . Acute encephalopathy . Dehydration . Renal failure, acute on chronic (HCC) . Anemia . Dementia . Tobacco abuse . Hypoglycemia  I have reviewed the medical record, interviewed the patient and family, and examined the patient. The following aspects are pertinent.  Past Medical  History  Diagnosis Date  . Anemia   . Dementia    Social History   Social History  . Marital Status: Married    Spouse Name: N/A  . Number of Children: N/A  . Years of Education: N/A   Social History Main Topics  . Smoking status: Current Every Day Smoker -- 0.50 packs/day    Types: Cigarettes  . Smokeless tobacco: None  . Alcohol Use: No  . Drug Use: No  . Sexual Activity: Not Asked   Other Topics Concern  . None   Social History Narrative  . None   Family History  Problem Relation Age of Onset  . Family history unknown: Yes   Scheduled Meds: . aspirin  325 mg Oral Daily  . cefTRIAXone (ROCEPHIN)  IV  1 g Intravenous Q24H  . enoxaparin (LOVENOX) injection  30 mg Subcutaneous Q24H  . feeding supplement (ENSURE ENLIVE)  237 mL Oral BID BM  . multivitamin with minerals  1 tablet Oral Daily  . nicotine  14 mg Transdermal Q2000  . potassium chloride  40 mEq Oral Once  . thiamine IV  100 mg Intravenous Daily   Continuous Infusions: . dextrose     PRN Meds:.acetaminophen **OR** acetaminophen, albuterol, haloperidol lactate, RESOURCE THICKENUP CLEAR Medications Prior to Admission:  Prior to Admission medications   Not on File   No Known Allergies Review of Systems  Unable to perform ROS: Dementia    Physical Exam  Constitutional: He appears lethargic. He appears cachectic.  Severe muscle wasting  Cardiovascular: Normal rate.   Pulmonary/Chest: No accessory muscle usage. No tachypnea. No respiratory distress.  Congested cough  Abdominal: Soft. Normal appearance.  Neurological: He appears lethargic. He is disoriented.    Vital Signs: BP 158/84 mmHg  Pulse 44  Temp(Src) 97.5 F (36.4 C) (Axillary)  Resp 20  Ht 5' 8"  (1.727 m)  Wt 54.432 kg (120 lb)  BMI 18.25 kg/m2  SpO2 89% Pain Assessment: 0-10   Pain Score: 0-No pain   SpO2: SpO2: (!) 89 % O2 Device:SpO2: (!) 89 % O2 Flow Rate: .O2 Flow Rate (L/min): 4 L/min  IO: Intake/output summary:    Intake/Output Summary (Last 24 hours) at 07/12/15 1735 Last data filed at 07/11/15 1911  Gross per 24 hour  Intake    340 ml  Output    300 ml  Net     40 ml    LBM: Last BM Date: 07/10/15 Baseline Weight: Weight: 54.432 kg (120 lb) Most recent weight: Weight: 54.432 kg (120 lb)     Palliative Assessment/Data:  Flowsheet Rows        Most Recent Value   Intake Tab    Referral Department  Hospitalist   Unit at Time of Referral  Med/Surg Unit   Palliative Care Primary Diagnosis  Neurology   Date Notified  07/11/15   Palliative Care Type  New Palliative care   Reason for referral  Clarify Goals of Care   Date of Admission  07/09/15   Date first seen by Palliative Care  07/12/15   # of days Palliative referral response time  1 Day(s)   # of days IP prior to Palliative referral  2   Clinical Assessment    Psychosocial & Spiritual Assessment    Palliative Care Outcomes       Time In: 0379 Time Out: 1630 Time Total: 40mn Greater than 50%  of this time was spent counseling and coordinating care related to the above assessment and plan.  Signed by: PPershing Proud NP   Please contact Palliative Medicine Team phone at [phone] questions and concerns.  For individual provider: See AShea Evans

## 2015-07-12 NOTE — Progress Notes (Signed)
PROGRESS NOTE  Roy Cole  ZOX:[id number] DOB: 12-11-1923  DOA: 07/09/2015 PCP: No primary care provider on file.  Outpatient Specialists:  None  Brief Narrative:  82 y.o. male, married, lives with spouse, independent of activities of daily living, has not seen a physician in decades, PMH of anemia, reported dementia, tobacco abuse, presented to [facility] ED on 07/09/15 with complaints of confusion, possible fall with small skin tears, hypoglycemia. Evaluation in ED revealed acute on chronic kidney disease, UTI, disconjugate gaze/rule out stroke. Admitted to telemetry. Neurology consulted. Despite treatment for UTI, continues to be quite confused and intermittently agitated. Palliative care was consulted for goals of care.   Assessment & Plan:   Principal Problem:   UTI (lower urinary tract infection) Active Problems:   Acute encephalopathy   Dehydration   Renal failure, acute on chronic (HCC)   Anemia   Dementia   Tobacco abuse   Hypoglycemia   Protein-calorie malnutrition, severe   Cerebral thrombosis with cerebral infarction   UTI - Treated empirically with IV Rocephin. Unfortunately urine culture results are suggestive of contamination. Blood cultures 2: Negative to date.  Dehydration - IV fluids. Dehydration resolved.  Acute encephalopathy - Secondary to acute medical illness (UTI, dehydration, acute kidney injury, stroke) complicating underlying dementia - ? Persistent mental status changes secondary to degree of irreversible injury/prolonged hypoglycemia. - Ongoing confusion and intermittent agitation.  Acute on chronic kidney disease - Baseline creatinine not known. Presented with creatinine of 1.7. - Hydrated with IV fluids and follow BMP. - Renal ultrasound: No right-sided hydronephrosis. Left kidney not visualized. - Creatinine has plateaued in the 1.4-1.5 range-probably his baseline.  Anemia, chronic, possibly chronic disease versus iron deficiency - Anemia panel  results as below. Hemoglobin stable.  Dementia with behavioral disturbance - As per spouse, felt to have dementia for the last year and gradually declining. - Mental status worse than baseline. Normally he is calm. - B-12: 398. TSH 1.952. - Periodic agitation. Use Haldol when necessary rather than benzodiazepines. Monitor. - Discussed at length with patient's spouse at bedside on 5/7. Palliative care consulted for goals of care.  Acute left brain stroke/Disconjugate gaze - Neurology was consulted and recommended MRI brain - MRI: Acute, nonhemorrhagic infarct in the left paramedian midbrain. - MRA brain: Not performed due to patient's inability to cooperate. - Carotid Dopplers: 1-39 percent ICA stenosis bilaterally. Vertebral arteries are patent with antegrade flow. - LDL: 59. - Hemoglobin A1c: Pending. - 2-D echo: LVEF 55-60 percent and grade 1 diastolic dysfunction. - Complete stroke workup: 2-D echo, carotid Dopplers, lipids. Follow A1c. - Start aspirin 325 MG daily. Started on dysphagia 1 diet and thin liquids per speech therapy recommendations. - Sinus rhythm on telemetry. - Neurology signed off. Follow-up with Dr. Pearlean Cole in 2 months.  Hypoglycemia, recurrent - ? Secondary to poor oral intake, renal insufficiency,? Other etiology.Encourage oral intake. Monitor - During next episode of low CBG, will check simultaneous blood glucose to make sure this is accurate. - On 5/7 night, CBG was 48 but simultaneous BMP showed glucose of 87.? Inaccurate fingerstick testing. - Ongoing poor oral intake. Change IV fluids to D10.? Related to renal insufficiency. - A1c: 4.9  Tobacco abuse - Nicotine patch  Possible fall at home with skin tears - PT and OT evaluation.  Hypertension - Allow for permissive hypertension given recent acute stroke.  Right kidney superior pole lesion, seen on renal ultrasound - Elective outpatient workup as deemed necessary.  SEVERE MALNUTRITION in the context of  Chronic  illness as evidenced by Severe muscle/fat loss -Management per dietitian  Elevated troponin - Unsure why this was performed. No chest pain reported.? Related to renal insufficiency and recent stroke. Echo this admission showed normal EF.  ? Hypoxia - Last night oxygen saturation was reported as 79% on room air. However no comment regarding patient's respiratory status. Chest x-ray with atelectasis. Follow clinically and wean off oxygen as tolerated  DVT prophylaxis: Lovenox Code Status: Full Family Communication: Discussed with spouse in detail, updated care and answered questions. Disposition Plan: Probably will need SNF when medically stable. Palliative care consulted for goals of care.   Consultants:   Neurology  Palliative care team  Procedures:   None  Antimicrobials:   IV Rocephin 5/5 >    Subjective: Over night events noted-hypoglycemia & hypoxia. Patient sitting on chair. Fidgety but more alert and oriented to self. Follows occasional instructions.  Objective:  Filed Vitals:   07/11/15 2134 07/11/15 2204 07/12/15 0805 07/12/15 1331  BP:  165/80 129/113 158/84  Pulse: 91  97 44  Temp:    97.5 F (36.4 C)  TempSrc:    Axillary  Resp: 20     Height:      Weight:      SpO2: 100%  100% 89%    Intake/Output Summary (Last 24 hours) at 07/12/15 1444 Last data filed at 07/11/15 1911  Gross per 24 hour  Intake    340 ml  Output    300 ml  Net     40 ml   Filed Weights   07/09/15 1440  Weight: 54.432 kg (120 lb)    Examination:  General exam: Moderately built, frail, cachectic, elderly male, chronically ill looking, lying Sitting up in her reclining chair this morning. Fidgety. Respiratory system: Clear to auscultation/Poor inspiratory effort.  Cardiovascular system: S1 & S2 heard, RRR.Marland Kitchen. No JVD, murmurs, rubs, gallops or clicks. No pedal edema. Telemetry: Sinus rhythm. Gastrointestinal system: Abdomen is nondistended, soft and nontender. No  organomegaly or masses felt. Normal bowel sounds heard. Central nervous system: Alert and oriented to self. Follows occasional instructions.  Disconjugate eye movements-seems slightly better compared to admission. Extremities: Symmetric 5 x 5 power. Mittens over bilateral hands. Skin: Superficial skin tears over right scapula, right hip and a larger skin tear over right mid forearm. No other acute findings noted. Psychiatry:  Unable to assess due to confusion.     Data Reviewed: I have personally reviewed following labs and imaging studies  CBC:  Recent Labs Lab 07/09/15 1523 07/10/15 0335 07/11/15 2143  WBC 9.3 9.4 8.4  HGB 10.9* 10.3* 10.1*  HCT 33.6* 32.7* 30.8*  MCV 87.3 87.2 85.1  PLT 167 170 156   Basic Metabolic Panel:  Recent Labs Lab 07/09/15 1523 07/10/15 0335 07/11/15 0608 07/11/15 2038 07/11/15 2143  NA 142 140 137 135 133*  K 4.2 4.1 4.0 3.7 3.4*  CL 110 109 105 103 101  CO2 22 20* 20* 22 23  GLUCOSE 95 108* 135* 87 72  BUN 14 16 15 13 13   CREATININE 1.71* 1.53* 1.52* 1.50* 1.46*  CALCIUM 8.8* 8.5* 8.3* 8.4* 8.1*   GFR: Estimated Creatinine Clearance: 26.4 mL/min (by C-G formula based on Cr of 1.46). Liver Function Tests:  Recent Labs Lab 07/09/15 1523  AST 24  ALT 17  ALKPHOS 59  BILITOT 0.8  PROT 7.0  ALBUMIN 3.5   No results for input(s): LIPASE, AMYLASE in the last 168 hours. No results for input(s): AMMONIA in the  last 168 hours. Coagulation Profile: No results for input(s): INR, PROTIME in the last 168 hours. Cardiac Enzymes:  Recent Labs Lab 07/11/15 2143  TROPONINI 0.24*   BNP (last 3 results) No results for input(s): PROBNP in the last 8760 hours. HbA1C:  Recent Labs  07/09/15 2228 07/10/15 1930  HGBA1C 4.9 4.9   CBG:  Recent Labs Lab 07/12/15 0413 07/12/15 0739 07/12/15 0910 07/12/15 1156 07/12/15 1334  GLUCAP 76 59* 95 62* 99   Lipid Profile:  Recent Labs  07/10/15 1213  CHOL 129  HDL 54  LDLCALC 59    TRIG 80  CHOLHDL 2.4   Thyroid Function Tests:  Recent Labs  07/09/15 2228  TSH 1.952   Anemia Panel:  Recent Labs  07/09/15 2228  VITAMINB12 398  FOLATE 14.2  FERRITIN 503*  TIBC 245*  IRON 20*  RETICCTPCT 1.3   Urine analysis:    Component Value Date/Time   COLORURINE YELLOW 07/09/2015 1527   APPEARANCEUR CLOUDY* 07/09/2015 1527   LABSPEC 1.012 07/09/2015 1527   PHURINE 5.5 07/09/2015 1527   GLUCOSEU NEGATIVE 07/09/2015 1527   HGBUR NEGATIVE 07/09/2015 1527   BILIRUBINUR NEGATIVE 07/09/2015 1527   KETONESUR NEGATIVE 07/09/2015 1527   PROTEINUR NEGATIVE 07/09/2015 1527   NITRITE POSITIVE* 07/09/2015 1527   LEUKOCYTESUR SMALL* 07/09/2015 1527   Sepsis Labs: (procalcitonin:4,lacticidven:4)  ) Recent Results (from the past 240 hour(s))  Blood culture (routine x 2)     Status: None (Preliminary result)   Collection Time: 07/09/15  3:23 PM  Result Value Ref Range Status   Specimen Description BLOOD LEFT FOREARM  Final   Special Requests BOTTLES DRAWN AEROBIC AND ANAEROBIC 5CC  Final   Culture NO GROWTH 2 DAYS  Final   Report Status PENDING  Incomplete  Urine culture     Status: Abnormal   Collection Time: 07/09/15  3:27 PM  Result Value Ref Range Status   Specimen Description URINE, CLEAN CATCH  Final   Special Requests Normal  Final   Culture MULTIPLE SPECIES PRESENT, SUGGEST RECOLLECTION (A)  Final   Report Status 07/11/2015 FINAL  Final  Blood culture (routine x 2)     Status: None (Preliminary result)   Collection Time: 07/09/15  5:20 PM  Result Value Ref Range Status   Specimen Description BLOOD LEFT WRIST  Final   Special Requests BOTTLES DRAWN AEROBIC AND ANAEROBIC 5CC  Final   Culture NO GROWTH 2 DAYS  Final   Report Status PENDING  Incomplete  Urine culture     Status: None (Preliminary result)   Collection Time: 07/09/15 10:06 PM  Result Value Ref Range Status   Specimen Description URINE, RANDOM  Final   Special Requests Normal   Final   Culture TOO YOUNG TO READ  Final   Report Status PENDING  Incomplete         Radiology Studies: Dg Chest Port 1 View  07/11/2015  CLINICAL DATA:  Hypoxia EXAM: PORTABLE CHEST 1 VIEW COMPARISON:  07/09/2015 FINDINGS: There is slight patchy opacity in the right base which is new from 07/09/2015 although there is a significantly shallower degree of inspiration today, and this may be atelectatic. No confluent consolidation. No large effusion. Normal pulmonary vasculature. Unchanged aortic tortuosity. IMPRESSION: Shallow inspiration. Mild atelectatic appearing opacity in the right lung base. Electronically Signed   By: Ellery Plunk M.D.   On: 07/11/2015 22:04        Scheduled Meds: . aspirin  325 mg Oral Daily  . cefTRIAXone (  ROCEPHIN)  IV  1 g Intravenous Q24H  . enoxaparin (LOVENOX) injection  30 mg Subcutaneous Q24H  . feeding supplement (ENSURE ENLIVE)  237 mL Oral BID BM  . multivitamin with minerals  1 tablet Oral Daily  . nicotine  14 mg Transdermal Q2000   Continuous Infusions:     LOS: 3 days    Time spent: 20 minutes.    [facility], MD [facility] Pager [phone]  If 7PM-7AM, please contact night-coverage [email or web address] Password [facility] 07/12/2015, 2:44 PM

## 2015-07-12 NOTE — Progress Notes (Signed)
Occupational Therapy Evaluation Patient Details Name: Roy Cole MRN: [id number] DOB: [birth year] Today's Date: 07/12/2015    History of Present Illness Roy Cole is a 82 y.o. male, lives with spouse, independent of activities of daily living, PMH of anemia, reported dementia, tobacco abuse, presented to [facility] ED on 07/09/15 with complaints of confusion.  Patient's spouse and 2 sons at bedside provided history. At baseline, patient apparently has memory deficits and intermittent confusion, gradually declining over the last 1 year, independent of activities of daily living and ambulates at home without help of assistance. MRI infarct in the left paramedian midbrain   Clinical Impression   PTA, pt was independent with ADLs, minor IADLs and mobility per pt's wife report. Pt's wife also reports that pt is not near is baseline regarding his mentation, confusion, speech and physical abilities. Pt currently requires mod-max assist for ADLs and max directional cueing for initiation and sequencing. Pt demonstrated restless and distractible behavior throughout session and required +2 assist for safety during mobility due to impulsivity, imbalance and significant visual deficits (probable diplopia). Recommend SNF for post-acute rehab stay and wife agrees as she feels she is unable to provide necessary physical assist that pt currently requires. Will continue to follow acutely.    Follow Up Recommendations  SNF;Supervision/Assistance - 24 hour    Equipment Recommendations  Other (comment) (TBD in next venue)    Recommendations for Other Services       Precautions / Restrictions Precautions Precautions: Fall Precaution Comments: dementia Restrictions Weight Bearing Restrictions: No      Mobility Bed Mobility Overal bed mobility: Needs Assistance Bed Mobility: Supine to Sit;Sit to Supine     Supine to sit: Min assist;HOB elevated Sit to supine: Mod assist;+2 for physical assistance    General bed mobility comments: Pt became distracted at end of session and was unable to follow commands/hand-under-hand cues to lay down - pt required mod +2 assist to complete task and max verbal cues.   Transfers Overall transfer level: Needs assistance Equipment used: 2 person hand held assist Transfers: Sit to/from Stand Sit to Stand: Mod assist;+2 physical assistance;From elevated surface         General transfer comment: Mod +2 assist for boost to stand and for balance due to posterior bias during transfer and upon standing. Max directional cues and pt resisting at times due to confusion. Pt with very narrow BOS and running into objects while ambulating due to decreased vision. Pt with increased unsteadiness and confusion as session progressed.    Balance Overall balance assessment: Needs assistance Sitting-balance support: No upper extremity supported;Feet supported Sitting balance-Leahy Scale: Fair Sitting balance - Comments: Able to reach outside of BOS to attempt to don shoes, but reaching to R of desired object - probable diplopia   Standing balance support: Single extremity supported;During functional activity Standing balance-Leahy Scale: Poor Standing balance comment: Reliant on UE support for balance and 2 person hand hold for ambulation due to unpredictability and poor balance                            ADL Overall ADL's : Needs assistance/impaired Eating/Feeding: Maximal assistance;With caregiver independent assisting;Sitting               Upper Body Dressing : Moderate assistance;Cueing for sequencing;Sitting   Lower Body Dressing: Maximal assistance;Cueing for sequencing;Sit to/from stand Lower Body Dressing Details (indicate cue type and reason): to don/doff shoes Toilet Transfer: Moderate assistance;+2 for  physical assistance;+2 for safety/equipment;Cueing for safety;Cueing for sequencing;Ambulation;BSC   Toileting- Clothing Manipulation and  Hygiene: Maximal assistance;Cueing for safety;Sit to/from stand;Cueing for sequencing       Functional mobility during ADLs: Moderate assistance;+2 for physical assistance General ADL Comments: Pt with increased confusion and unable to follow most commands. Pt responded well sporadically to hand-under-hand techniques. Pt's wife present for OT eval.     Vision Vision Assessment?: Yes Eye Alignment: Impaired (comment) (R eye laterally positioned) Ocular Range of Motion: Impaired-to be further tested in functional context (unable to follow commands) Tracking/Visual Pursuits: Other (comment);Impaired - to be further tested in functional context (unable to follow commands) Depth Perception: Undershoots Additional Comments: Pt with dysconjugate gaze with R eye aligning laterally. Pt with probable double vision and would reach to R of desired objects during functional tasks. Pt unable to follow commands for any visual testing and unable to verablize what he was able to see. Pt unable to name colors of objects in all quadrants and unable to name objects (e.g. bed, table, cake, wife, etc...). Question diplopia and significantly decreased vision.   Perception Perception Perception Tested?: No (unable to follow commands)   Praxis Praxis Praxis tested?: Not tested    Pertinent Vitals/Pain Pain Assessment: Faces Faces Pain Scale: No hurt     Hand Dominance Right   Extremity/Trunk Assessment Upper Extremity Assessment Upper Extremity Assessment: Generalized weakness;Difficult to assess due to impaired cognition   Lower Extremity Assessment Lower Extremity Assessment: Generalized weakness;Difficult to assess due to impaired cognition   Cervical / Trunk Assessment Cervical / Trunk Assessment: Kyphotic   Communication Communication Communication: Expressive difficulties (difficult to understand speech)   Cognition Arousal/Alertness: Awake/alert Behavior During Therapy: Restless Overall  Cognitive Status: Impaired/Different from baseline Area of Impairment: Safety/judgement;Awareness;Attention;Orientation;Following commands;Problem solving Orientation Level: Disoriented to;Place;Time;Situation Current Attention Level: Sustained Memory: Decreased short-term memory Following Commands: Follows one step commands inconsistently Safety/Judgement: Decreased awareness of safety;Decreased awareness of deficits Awareness: Intellectual Problem Solving: Decreased initiation;Difficulty sequencing;Requires verbal cues;Requires tactile cues General Comments: restless in bed. Speech was unintelligible most of the time. Difficult to redirect. Distracted.    General Comments       Exercises       Shoulder Instructions      Home Living Family/patient expects to be discharged to:: Private residence Living Arrangements: Spouse/significant other Available Help at Discharge: Family;Available 24 hours/day Type of Home: House       Home Layout: Two level Alternate Level Stairs-Number of Steps: flight   Bathroom Shower/Tub: Tub/shower unit Shower/tub characteristics: Engineer, building services: Standard         Additional Comments: Pt's wife requesting SNF for more rehab prior to returning home      Prior Functioning/Environment Level of Independence: Independent        Comments: Does not use AD, able to complete all ADLs and some minor IADLs without assistance or supervision.    OT Diagnosis: Generalized weakness;Cognitive deficits;Disturbance of vision;Altered mental status   OT Problem List: Decreased activity tolerance;Impaired balance (sitting and/or standing);Impaired vision/perception;Decreased coordination;Decreased cognition;Decreased safety awareness;Decreased knowledge of use of DME or AE   OT Treatment/Interventions: Self-care/ADL training;Therapeutic exercise;DME and/or AE instruction;Energy conservation;Therapeutic activities;Cognitive  remediation/compensation;Visual/perceptual remediation/compensation;Patient/family education;Balance training    OT Goals(Current goals can be found in the care plan section) Acute Rehab OT Goals Patient Stated Goal: unable to state OT Goal Formulation: With patient Time For Goal Achievement: 07/26/15 Potential to Achieve Goals: Fair ADL Goals Pt Will Perform Grooming: with min guard assist;standing;with caregiver independent in assisting  Pt Will Perform Upper Body Dressing: with min guard assist;sitting;with caregiver independent in assisting Pt Will Perform Lower Body Dressing: with min guard assist;with caregiver independent in assisting;sitting/lateral leans;sit to/from stand Pt Will Transfer to Toilet: with min guard assist;ambulating;bedside commode (over toilet) Pt Will Perform Toileting - Clothing Manipulation and hygiene: with min guard assist;with caregiver independent in assisting;sitting/lateral leans;sit to/from stand Additional ADL Goal #1: Pt will accurately identify 2 objects in room with min verbal cueing.    OT Frequency: Min 2X/week   Barriers to D/C: Decreased caregiver support  Wife stated she is unable to provide this level of support in pt's current state       Co-evaluation PT/OT/SLP Co-Evaluation/Treatment: Yes Reason for Co-Treatment: Necessary to address cognition/behavior during functional activity;For patient/therapist safety PT goals addressed during session: Mobility/safety with mobility;Strengthening/ROM;Balance OT goals addressed during session: ADL's and self-care;Strengthening/ROM      End of Session Equipment Utilized During Treatment: Gait belt Nurse Communication: Mobility status  Activity Tolerance: Patient tolerated treatment well Patient left: in bed;with call bell/phone within reach;with bed alarm set;with family/visitor present   Time: 4540-9811 OT Time Calculation (min): 41 min Charges:  OT General Charges $OT Visit: 1 Procedure OT  Evaluation $OT Eval High Complexity: 1 Procedure OT Treatments $Self Care/Home Management : 8-22 mins G-Codes:    Nils Pyle, OTR/L Pager: [phone]  07/12/2015, 5:11 PM

## 2015-07-13 LAB — GLUCOSE, CAPILLARY
GLUCOSE-CAPILLARY: 114 mg/dL — AB (ref 65–99)
Glucose-Capillary: 71 mg/dL (ref 65–99)
Glucose-Capillary: 102 mg/dL — ABNORMAL HIGH (ref 65–99)
Glucose-Capillary: 97 mg/dL (ref 65–99)
Glucose-Capillary: 83 mg/dL (ref 65–99)
Glucose-Capillary: 95 mg/dL (ref 65–99)

## 2015-07-13 LAB — C-PEPTIDE: C-Peptide: 4.5 ng/mL — ABNORMAL HIGH (ref 1.1–4.4)

## 2015-07-13 MED ORDER — VITAMIN B-1 100 MG PO TABS
100.0000 mg | ORAL_TABLET | Freq: Every day | ORAL | Status: DC
  Administered 2015-07-13 – 2015-07-16 (×4): 100 mg via ORAL
  Filled 2015-07-13 (×5): qty 1

## 2015-07-13 NOTE — Progress Notes (Addendum)
PROGRESS NOTE  Roy Cole  ZOX:[id number] DOB: 15-Nov-1923  DOA: 07/09/2015 PCP: No primary care provider on file.  Outpatient Specialists:  None  Brief Narrative:  82 y.o. male, married, lives with spouse, independent of activities of daily living, has not seen a physician in decades, PMH of anemia, reported dementia, tobacco abuse, presented to [facility] ED on 07/09/15 with complaints of confusion, possible fall with small skin tears, hypoglycemia. Evaluation in ED revealed acute on chronic kidney disease, UTI, disconjugate gaze/rule out stroke. Admitted to telemetry. Neurology consulted. Despite treatment for UTI, continued to be quite confused and intermittently agitated. Palliative care was consulted for goals of care. Recurrent hypoglycemia/ongoing evaluation. Seems to be improving. Mental status better on 5/9 than it has been since admission.   Assessment & Plan:   Principal Problem:   UTI (lower urinary tract infection) Active Problems:   Acute encephalopathy   Dehydration   Renal failure, acute on chronic (HCC)   Anemia   Dementia   Tobacco abuse   Hypoglycemia   Protein-calorie malnutrition, severe   Cerebral thrombosis with cerebral infarction   Palliative care encounter   UTI - Treated empirically with IV Rocephin. Unfortunately urine culture results are suggestive of contamination. Blood cultures 2: Negative to date. - A second urine culture shows coagulase negative staph-unclear significance. - Continue Rocephin (completed 4 days). Monitor clinically.  Dehydration - IV fluids. Dehydration resolved.  Acute encephalopathy - Secondary to acute medical illness (UTI, dehydration, acute kidney injury, stroke) complicating underlying dementia - ? Persistent mental status changes secondary to degree of irreversible injury/prolonged hypoglycemia. - Ongoing confusion and intermittent agitation are better today. Patient did not seem agitated this morning and was actually answering  a few questions and following some instructions. - Follow B1 level. Started thiamine given history of malnutrition.  Acute on chronic kidney disease - Baseline creatinine not known. Presented with creatinine of 1.7. - Hydrated with IV fluids and follow BMP. - Renal ultrasound: No right-sided hydronephrosis. Left kidney not visualized. - Creatinine has plateaued in the 1.4-1.5 range-probably his baseline.  Anemia, chronic, possibly chronic disease versus iron deficiency - Anemia panel results as below. Hemoglobin stable.  Dementia with behavioral disturbance - As per spouse, felt to have dementia for the last year and gradually declining. - Mental status worse than baseline. Normally he is calm. - B-12: 398. TSH 1.952. - Periodic agitation. Use Haldol when necessary rather than benzodiazepines. Monitor. - Mental status slightly better on 5/9.  Acute left brain stroke/Disconjugate gaze - Neurology was consulted and recommended MRI brain - MRI: Acute, nonhemorrhagic infarct in the left paramedian midbrain. - MRA brain: Not performed due to patient's inability to cooperate. - Carotid Dopplers: 1-39 percent ICA stenosis bilaterally. Vertebral arteries are patent with antegrade flow. - LDL: 59. - Hemoglobin A1c: 4.9. - 2-D echo: LVEF 55-60 percent and grade 1 diastolic dysfunction. - Start aspirin 325 MG daily. Started on dysphagia 1 diet and thin liquids per speech therapy recommendations. - Sinus rhythm on telemetry. - Neurology signed off. Follow-up with Dr. Pearlean Brownie in 2 months.  Hypoglycemia, recurrent - ? Secondary to poor oral intake, renal insufficiency,? Other etiology.Encourage oral intake. Monitor - On 5/7 night, CBG was 48 but simultaneous BMP showed glucose of 87.? Inaccurate fingerstick testing. - Ongoing poor oral intake. Change IV fluids to D10.? Related to renal insufficiency. - A1c: 4.9. Working up hypoglycemia: Cortisol: 17.8, proinsulin/insulin ratio: Pending, C-peptide  mildly elevated: 4.5, beta hydroxybutyric acid <0.05 and sulfonylurea hypoglycemic panel pending  Tobacco  abuse - Nicotine patch  Possible fall at home with skin tears - PT and OT evaluation. Recommend SNF.  Hypertension - Allow for permissive hypertension given recent acute stroke.  Right kidney superior pole lesion, seen on renal ultrasound - Elective outpatient workup as deemed necessary.  SEVERE MALNUTRITION in the context of Chronic illness as evidenced by Severe muscle/fat loss -Management per dietitian. Started thiamine.  Elevated troponin - Unsure why this was performed. No chest pain reported.? Related to renal insufficiency and recent stroke. Echo this admission showed normal EF.  ? Hypoxia - On 5/7 night, oxygen saturation was reported as 79% on room air. However no comment regarding patient's respiratory status. Chest x-ray with atelectasis. Follow clinically and wean off oxygen as tolerated - Seems to have resolved. Oxygen saturation 99% on room air today.  DVT prophylaxis: Lovenox Code Status: DO NOT RESUSCITATE Family Communication: None at bedside today. Disposition Plan: Probably will need SNF when medically stable. Palliative care consulted for goals of care.   Consultants:   Neurology-signed off  Palliative care team  Procedures:   None  Antimicrobials:   IV Rocephin 5/5 > 5/9   Subjective: Alert, oriented to self. "I'm okay". Said "thank you" after finished seeing him. Stated that he has maybe 4 children (per palliative care team, has 3).  Objective:  Filed Vitals:   07/12/15 2215 07/12/15 2234 07/13/15 0459 07/13/15 1456  BP: 161/104 154/74 157/90 127/75  Pulse: 82 99 78 83  Temp:  98.3 F (36.8 C) 98.2 F (36.8 C) 97.4 F (36.3 C)  TempSrc:  Oral Core (Comment) Axillary  Resp: Height:      Weight:      SpO2:  99% 99% 99%    Intake/Output Summary (Last 24 hours) at 07/13/15 1523 Last data filed at 07/13/15 1300   Gross per 24 hour  Intake    210 ml  Output    600 ml  Net   -390 ml   Filed Weights   07/09/15 1440  Weight: 54.432 kg (120 lb)    Examination:  General exam: Moderately built, frail, cachectic, elderly male, chronically ill looking, lying down comfortably in bed. Does not look restless or fidgety. Calm and comfortable. Respiratory system: Clear to auscultation/Poor inspiratory effort.  Cardiovascular system: S1 & S2 heard, RRR.Marland Kitchen No JVD, murmurs, rubs, gallops or clicks. No pedal edema. Gastrointestinal system: Abdomen is nondistended, soft and nontender. No organomegaly or masses felt. Normal bowel sounds heard. Central nervous system: Alert and oriented to self. Follows simple instructions - open mouth, stick out tongue, raise arms.  Disconjugate eye movements-seems slightly better compared to admission. Extremities: Symmetric 5 x 5 power. Mittens over bilateral hands. Skin: Superficial skin tears over right scapula, right hip and a larger skin tear over right mid forearm. No other acute findings noted. Psychiatry:  Unable to assess due to confusion.     Data Reviewed: I have personally reviewed following labs and imaging studies  CBC:  Recent Labs Lab 07/09/15 1523 07/10/15 0335 07/11/15 2143  WBC 9.3 9.4 8.4  HGB 10.9* 10.3* 10.1*  HCT 33.6* 32.7* 30.8*  MCV 87.3 87.2 85.1  PLT 167 170 156   Basic Metabolic Panel:  Recent Labs Lab 07/09/15 1523 07/10/15 0335 07/11/15 0608 07/11/15 2038 07/11/15 2143  NA 142 140 137 135 133*  K 4.2 4.1 4.0 3.7 3.4*  CL 110 109 105 103 101  CO2 22 20* 20* 22 23  GLUCOSE 95 108* 135*  87 72  BUN 14 16 15 13 13   CREATININE 1.71* 1.53* 1.52* 1.50* 1.46*  CALCIUM 8.8* 8.5* 8.3* 8.4* 8.1*   GFR: Estimated Creatinine Clearance: 26.4 mL/min (by C-G formula based on Cr of 1.46). Liver Function Tests:  Recent Labs Lab 07/09/15 1523  AST 24  ALT 17  ALKPHOS 59  BILITOT 0.8  PROT 7.0  ALBUMIN 3.5   No results for input(s):  LIPASE, AMYLASE in the last 168 hours. No results for input(s): AMMONIA in the last 168 hours. Coagulation Profile: No results for input(s): INR, PROTIME in the last 168 hours. Cardiac Enzymes:  Recent Labs Lab 07/11/15 2143  TROPONINI 0.24*   BNP (last 3 results) No results for input(s): PROBNP in the last 8760 hours. HbA1C:  Recent Labs  07/10/15 1930  HGBA1C 4.9   CBG:  Recent Labs Lab 07/12/15 2222 07/13/15 0130 07/13/15 0445 07/13/15 0827 07/13/15 1131  GLUCAP 76 71 102* 97 114*   Lipid Profile: No results for input(s): CHOL, HDL, LDLCALC, TRIG, CHOLHDL, LDLDIRECT in the last 72 hours. Thyroid Function Tests: No results for input(s): TSH, T4TOTAL, FREET4, T3FREE, THYROIDAB in the last 72 hours. Anemia Panel: No results for input(s): VITAMINB12, FOLATE, FERRITIN, TIBC, IRON, RETICCTPCT in the last 72 hours. Urine analysis:    Component Value Date/Time   COLORURINE YELLOW 07/09/2015 1527   APPEARANCEUR CLOUDY* 07/09/2015 1527   LABSPEC 1.012 07/09/2015 1527   PHURINE 5.5 07/09/2015 1527   GLUCOSEU NEGATIVE 07/09/2015 1527   HGBUR NEGATIVE 07/09/2015 1527   BILIRUBINUR NEGATIVE 07/09/2015 1527   KETONESUR NEGATIVE 07/09/2015 1527   PROTEINUR NEGATIVE 07/09/2015 1527   NITRITE POSITIVE* 07/09/2015 1527   LEUKOCYTESUR SMALL* 07/09/2015 1527   Sepsis Labs: @LABRCNTIP (procalcitonin:4,lacticidven:4)  ) Recent Results (from the past 240 hour(s))  Blood culture (routine x 2)     Status: None (Preliminary result)   Collection Time: 07/09/15  3:23 PM  Result Value Ref Range Status   Specimen Description BLOOD LEFT FOREARM  Final   Special Requests BOTTLES DRAWN AEROBIC AND ANAEROBIC 5CC  Final   Culture NO GROWTH 3 DAYS  Final   Report Status PENDING  Incomplete  Urine culture     Status: Abnormal   Collection Time: 07/09/15  3:27 PM  Result Value Ref Range Status   Specimen Description URINE, CLEAN CATCH  Final   Special Requests Normal  Final   Culture  MULTIPLE SPECIES PRESENT, SUGGEST RECOLLECTION (A)  Final   Report Status 07/11/2015 FINAL  Final  Blood culture (routine x 2)     Status: None (Preliminary result)   Collection Time: 07/09/15  5:20 PM  Result Value Ref Range Status   Specimen Description BLOOD LEFT WRIST  Final   Special Requests BOTTLES DRAWN AEROBIC AND ANAEROBIC 5CC  Final   Culture NO GROWTH 3 DAYS  Final   Report Status PENDING  Incomplete  Urine culture     Status: Abnormal (Preliminary result)   Collection Time: 07/09/15 10:06 PM  Result Value Ref Range Status   Specimen Description URINE, RANDOM  Final   Special Requests Normal  Final   Culture (A)  Final    >=100,000 COLONIES/mL STAPHYLOCOCCUS SPECIES (COAGULASE NEGATIVE) SUSCEPTIBILITIES TO FOLLOW    Report Status PENDING  Incomplete         Radiology Studies: Dg Chest Port 1 View  07/11/2015  CLINICAL DATA:  Hypoxia EXAM: PORTABLE CHEST 1 VIEW COMPARISON:  07/09/2015 FINDINGS: There is slight patchy opacity in the right base which  is new from 07/09/2015 although there is a significantly shallower degree of inspiration today, and this may be atelectatic. No confluent consolidation. No large effusion. Normal pulmonary vasculature. Unchanged aortic tortuosity. IMPRESSION: Shallow inspiration. Mild atelectatic appearing opacity in the right lung base. Electronically Signed   By: Ellery Plunk M.D.   On: 07/11/2015 22:04        Scheduled Meds: . aspirin  325 mg Oral Daily  . cefTRIAXone (ROCEPHIN)  IV  1 g Intravenous Q24H  . enoxaparin (LOVENOX) injection  30 mg Subcutaneous Q24H  . feeding supplement (ENSURE ENLIVE)  237 mL Oral BID BM  . multivitamin with minerals  1 tablet Oral Daily  . nicotine  14 mg Transdermal Q2000  . thiamine  100 mg Oral Daily   Continuous Infusions: . dextrose 75 mL/hr at 07/13/15 1151     LOS: 4 days    Time spent: 20 minutes.    [facility], MD [facility] Pager [phone]  If 7PM-7AM,  please contact night-coverage [email or web address] Password [email or web address] 07/13/2015, 3:23 PM

## 2015-07-13 NOTE — Progress Notes (Signed)
Non-violent restraints discontinued. Pt. Has been calm and resting throughout the night and morning. Shows no signs of needing restraints. Low bed ordered. Will continue to monitor.

## 2015-07-13 NOTE — Progress Notes (Signed)
Daily Progress Note   Patient Name: Roy Cole       Date: 07/13/2015 DOB: 1923-04-13  Age: 82 y.o. MRN#: [id number] Attending Physician: Elease EtienneAnand D Hongalgi, MD Primary Care Physician: No primary care provider on file. Admit Date: 07/09/2015  Reason for Consultation/Follow-up: Establishing goals of care  Subjective: Mr. Roy BachBurns has been reportedly more alert today but is now more lethargic and appears to be resting comfortably. Wife is at bedside and is hopeful for SNF rehab at [facility] which is close to her home (she has already toured the facility and was pleased). She is hopeful for some improvement but also understands the barriers to improvement. She confirms with me that she discussed with her sons and they all agree he should not be resuscitated - DNR placed. However they do still want gentle treatments with hope of some recovery and QOL (especially with their first grandchild due to be born in June). We did discuss the benefits of hospice yesterday. Plan is to pursue SNF rehab. I will continue to follow.   Length of Stay: 4  Current Medications: Scheduled Meds:  . aspirin  325 mg Oral Daily  . cefTRIAXone (ROCEPHIN)  IV  1 g Intravenous Q24H  . enoxaparin (LOVENOX) injection  30 mg Subcutaneous Q24H  . feeding supplement (ENSURE ENLIVE)  237 mL Oral BID BM  . multivitamin with minerals  1 tablet Oral Daily  . nicotine  14 mg Transdermal Q2000  . thiamine  100 mg Oral Daily    Continuous Infusions: . dextrose 75 mL/hr at 07/13/15 1151    PRN Meds: acetaminophen **OR** acetaminophen, albuterol, haloperidol lactate, RESOURCE THICKENUP CLEAR  Physical Exam  Constitutional: He appears lethargic. He appears cachectic.  Severe muscle wasting  Cardiovascular: Normal rate.     Pulmonary/Chest: No accessory muscle usage. No tachypnea. No respiratory distress.  + cough  Abdominal: Soft. Normal appearance.  Neurological: He appears lethargic.            Vital Signs: BP 157/90 mmHg  Pulse 78  Temp(Src) 98.2 F (36.8 C) (Core (Comment))  Resp 16  Ht 5\' 8"  (1.727 m)  Wt 54.432 kg (120 lb)  BMI 18.25 kg/m2  SpO2 99% SpO2: SpO2: 99 % O2 Device: O2 Device: Not Delivered O2 Flow Rate: O2 Flow Rate (L/min): 4 L/min  Intake/output summary:  Intake/Output Summary (Last 24 hours) at 07/13/15 1202 Last data filed at 07/13/15 1610  Gross per 24 hour  Intake    210 ml  Output    600 ml  Net   -390 ml   LBM: Last BM Date: 07/10/15 Baseline Weight: Weight: 54.432 kg (120 lb) Most recent weight: Weight: 54.432 kg (120 lb)       Palliative Assessment/Data:    Flowsheet Rows        Most Recent Value   Intake Tab    Referral Department  Hospitalist   Unit at Time of Referral  Med/Surg Unit   Palliative Care Primary Diagnosis  Neurology   Date Notified  07/11/15   Palliative Care Type  New Palliative care   Reason for referral  Clarify Goals of Care   Date of Admission  07/09/15   Date first seen by Palliative Care  07/12/15   # of days Palliative referral response time  1 Day(s)   # of days IP prior to Palliative referral  2   Clinical Assessment    Psychosocial & Spiritual Assessment    Palliative Care Outcomes       Patient Active Problem List   Diagnosis Date Noted  . Palliative care encounter   . Protein-calorie malnutrition, severe 07/11/2015  . Cerebral thrombosis with cerebral infarction 07/11/2015  . UTI (lower urinary tract infection) 07/09/2015  . Acute encephalopathy 07/09/2015  . Dehydration 07/09/2015  . Renal failure, acute on chronic (HCC) 07/09/2015  . Anemia 07/09/2015  . Dementia 07/09/2015  . Tobacco abuse 07/09/2015  . Hypoglycemia 07/09/2015    Palliative Care Assessment & Plan   Patient Profile: 82 y.o. male with  past medical history of anemia, reported dementia, tobacco abuse admitted on 07/09/2015 with confusion. Treatment for UTI, acute ischemic infarct of left midbrain, and delirium on top of progressing dementia.     Recommendations/Plan:  Agitation/delirium: Agree with low dose haldol prn. Continue nicotine patch.    Fall risk: Seems to want to get out of bed to use bathroom and to smoke.   Code Status:    Code Status Orders        Start     Ordered   07/13/15 1203  Do not attempt resuscitation (DNR)   Continuous    Question Answer Comment  In the event of cardiac or respiratory ARREST Do not call a "code blue"   In the event of cardiac or respiratory ARREST Do not perform Intubation, CPR, defibrillation or ACLS   In the event of cardiac or respiratory ARREST Use medication by any route, position, wound care, and other measures to relive pain and suffering. May use oxygen, suction and manual treatment of airway obstruction as needed for comfort.      07/13/15 1202    Code Status History    Date Active Date Inactive Code Status Order ID Comments User Context   07/09/2015  7:48 PM 07/13/2015 12:02 PM Full Code [id number]  Elease Etienne, MD Inpatient       Prognosis:  Prognosis very poor with progressing dementia and now stroke with increased confusion, AMS, poor intake. < 6 months and I fear prognosis could be much less.  Discharge Planning:  Skilled Nursing Facility for rehab with Palliative care service follow-up  Care plan was discussed with Dr. Waymon Amato  Thank you for allowing the Palliative Medicine Team to assist in the care of this patient.   Time In: 1145 Time Out: 1210 Total Time  Prolonged Time Billed  no       Greater than 50%  of this time was spent counseling and coordinating care related to the above assessment and plan.  Ulice Bold, NP  Please contact Palliative Medicine Team phone at [phone] for questions and concerns.

## 2015-07-14 ENCOUNTER — Inpatient Hospital Stay (HOSPITAL_COMMUNITY): Payer: Medicare Other

## 2015-07-14 DIAGNOSIS — F0390 Unspecified dementia without behavioral disturbance: Secondary | ICD-10-CM

## 2015-07-14 LAB — GLUCOSE, CAPILLARY
GLUCOSE-CAPILLARY: 68 mg/dL (ref 65–99)
GLUCOSE-CAPILLARY: 95 mg/dL (ref 65–99)
GLUCOSE-CAPILLARY: 92 mg/dL (ref 65–99)
Glucose-Capillary: 113 mg/dL — ABNORMAL HIGH (ref 65–99)
Glucose-Capillary: 122 mg/dL — ABNORMAL HIGH (ref 65–99)
Glucose-Capillary: 90 mg/dL (ref 65–99)
Glucose-Capillary: 90 mg/dL (ref 65–99)
Glucose-Capillary: 92 mg/dL (ref 65–99)

## 2015-07-14 LAB — CULTURE, BLOOD (ROUTINE X 2)
CULTURE: NO GROWTH
CULTURE: NO GROWTH

## 2015-07-14 LAB — BASIC METABOLIC PANEL
Anion gap: 10 (ref 5–15)
BUN: 14 mg/dL (ref 6–20)
CALCIUM: 8.1 mg/dL — AB (ref 8.9–10.3)
CHLORIDE: 99 mmol/L — AB (ref 101–111)
CO2: 21 mmol/L — AB (ref 22–32)
Creatinine, Ser: 1.47 mg/dL — ABNORMAL HIGH (ref 0.61–1.24)
GFR calc Af Amer: 47 mL/min — ABNORMAL LOW (ref >60)
GFR calc non Af Amer: 40 mL/min — ABNORMAL LOW (ref >60)
GLUCOSE: 110 mg/dL — AB (ref 65–99)
Potassium: 4.4 mmol/L (ref 3.5–5.1)
Sodium: 130 mmol/L — ABNORMAL LOW (ref 135–145)

## 2015-07-14 LAB — VITAMIN B1: VITAMIN B1 (THIAMINE): 226.4 nmol/L — AB (ref 66.5–200.0)

## 2015-07-14 MED ORDER — HALOPERIDOL LACTATE 5 MG/ML IJ SOLN
1.0000 mg | INTRAMUSCULAR | Status: AC
  Administered 2015-07-14: 1 mg via INTRAMUSCULAR
  Filled 2015-07-14: qty 1

## 2015-07-14 MED ORDER — SODIUM CHLORIDE 1 G PO TABS
1.0000 g | ORAL_TABLET | Freq: Two times a day (BID) | ORAL | Status: DC
Start: 2015-07-14 — End: 2015-07-16
  Administered 2015-07-14 – 2015-07-16 (×4): 1 g via ORAL
  Filled 2015-07-14 (×5): qty 1

## 2015-07-14 MED ORDER — TRAMADOL HCL 50 MG PO TABS
50.0000 mg | ORAL_TABLET | Freq: Four times a day (QID) | ORAL | Status: AC | PRN
  Administered 2015-07-14 – 2015-07-15 (×2): 50 mg via ORAL
  Filled 2015-07-14 (×2): qty 1

## 2015-07-14 MED ORDER — DOXYCYCLINE HYCLATE 100 MG PO TABS
100.0000 mg | ORAL_TABLET | Freq: Two times a day (BID) | ORAL | Status: DC
  Administered 2015-07-14 – 2015-07-16 (×5): 100 mg via ORAL
  Filled 2015-07-14 (×5): qty 1

## 2015-07-14 MED ORDER — HALOPERIDOL LACTATE 5 MG/ML IJ SOLN
1.0000 mg | Freq: Two times a day (BID) | INTRAMUSCULAR | Status: DC | PRN
  Filled 2015-07-14: qty 1

## 2015-07-14 MED ORDER — DEXTROSE-NACL 5-0.9 % IV SOLN
INTRAVENOUS | Status: DC
  Administered 2015-07-14: 12:00:00 via INTRAVENOUS
  Filled 2015-07-14 (×3): qty 1000

## 2015-07-14 MED ORDER — HALOPERIDOL LACTATE 5 MG/ML IJ SOLN: 1.0000 mg | INTRAMUSCULAR | Status: DC

## 2015-07-14 NOTE — Progress Notes (Signed)
PT Cancellation Note  Patient Details Name: Roy Cole MRN: [id number] DOB: 03-Apr-1923   Cancelled Treatment:    Reason Eval/Treat Not Completed: Other (comment) (nursing staff in with pt due to very recent fall) Pt being monitored by nursing due to fall. PT will continue to follow.     Derek MoundKellyn R Karrina Lye Avian Greenawalt, PTA Pager: [phone]   07/14/2015, 3:27 PM

## 2015-07-14 NOTE — Plan of Care (Addendum)
Pt's IV infiltrated and pt's wife refused restart of IV for pt.  Later, RN paged back stating a tick was removed from pt's scrotum. No redness at site. Pt is already on Doxy. Will follow.

## 2015-07-14 NOTE — Progress Notes (Signed)
Hypoglycemic Event  CBG: 68  Treatment: 4 oz orange juice given  Symptoms: asymptomatic  Follow-up CBG: Time:8:27 CBG Result: 95  Possible Reasons for Event: poor oral intake  Arnesha Schiraldi L Price

## 2015-07-14 NOTE — Care Management Note (Addendum)
Case Management Note  Patient Details  Name: Roy Cole MRN: [id number] Date of Birth: October 25, 1923  Subjective/Objective:                 Patient from home with wife. Anticipate DC to SNF with Palliative.   Action/Plan:  DC to SNF as facilitated by CSW. Addendum 07-16-15 [facility] as facilitated by CSW.  Expected Discharge Date:                  Expected Discharge Plan:  Skilled Nursing Facility  In-House Referral:  Clinical Social Work  Discharge planning Services  CM Consult  Post Acute Care Choice:  NA Choice offered to:     DME Arranged:    DME Agency:     HH Arranged:    HH Agency:     Status of Service:  Completed, signed off  Medicare Important Message Given:    Date Medicare IM Given:    Medicare IM give by:    Date Additional Medicare IM Given:    Additional Medicare Important Message give by:     If discussed at Long Length of Stay Meetings, dates discussed:    Additional Comments:  Lawerance SabalDebbie Aarion Metzgar, RN 07/14/2015, 11:43 AM

## 2015-07-14 NOTE — Progress Notes (Signed)
Daily Progress Note   Patient Name: Roy Cole       Date: 07/14/2015 DOB: 03/17/1923  Age: 82 y.o. MRN#: [id number] Attending Physician: Rhetta Mura, MD Primary Care Physician: No primary care provider on file. Admit Date: 07/09/2015  Reason for Consultation/Follow-up: Establishing goals of care  Subjective: Roy Cole seems more alert today. Roy Cole is at bedside. She says that he ate well for breakfast and even fed himself. He has also been up to bedside commode with assistance. She is pleased with this improvement. However - I did speak with her about my concern for his continued hypoglycemia even with D10 infusion. I explained that this is very dangerous and that if we were not correcting his hypoglycemia this would lead to his death. She explored many options as to why he is hypoglycemic but I am not quite sure.   I share my fears that difficult decisions will need to be made regarding his end of life and how he may spend his last days. Roy Cole is very understanding but also feels like she must continue to be hopeful for improvement. She does say that she does not believe that family could manage him dying at home. She is not yet ready for a transition to comfort but does have a good understanding that he may be at end of life. She would like some time to continue attempts for improvement and for her to process these thoughts.   Length of Stay: 5  Current Medications: Scheduled Meds:  . aspirin  325 mg Oral Daily  . doxycycline  100 mg Oral Q12H  . enoxaparin (LOVENOX) injection  30 mg Subcutaneous Q24H  . feeding supplement (ENSURE ENLIVE)  237 mL Oral BID BM  . multivitamin with minerals  1 tablet Oral Daily  . nicotine  14 mg Transdermal Q2000  . sodium chloride  1 g  Oral BID WC  . thiamine  100 mg Oral Daily    Continuous Infusions: . dextrose 5 % and 0.9% NaCl 1,000 mL infusion      PRN Meds: acetaminophen **OR** acetaminophen, albuterol, haloperidol lactate, RESOURCE THICKENUP CLEAR  Physical Exam          Vital Signs: BP 135/69 mmHg  Pulse 81  Temp(Src) 97.9 F (36.6 Cole) (Oral)  Resp 20  Ht  (  1.727 m)  Wt 53.071 kg (117 lb)  BMI 17.79 kg/m2  SpO2 92% SpO2: SpO2: 92 % O2 Device: O2 Device: Not Delivered O2 Flow Rate: O2 Flow Rate (L/min): 4 L/min  Intake/output summary:  Intake/Output Summary (Last 24 hours) at 07/14/15 1615 Last data filed at 07/14/15 0930  Gross per 24 hour  Intake   1955 ml  Output    226 ml  Net   1729 ml   LBM: Last BM Date: 07/10/15 Baseline Weight: Weight: 54.432 kg (120 lb) Most recent weight: Weight: 53.071 kg (117 lb)       Palliative Assessment/Data:    Flowsheet Rows        Most Recent Value   Intake Tab    Referral Department  Hospitalist   Unit at Time of Referral  Med/Surg Unit   Palliative Care Primary Diagnosis  Neurology   Date Notified  07/11/15   Palliative Care Type  New Palliative care   Reason for referral  Clarify Goals of Care   Date of Admission  07/09/15   Date first seen by Palliative Care  07/12/15   # of days Palliative referral response time  1 Day(s)   # of days IP prior to Palliative referral  2   Clinical Assessment    Psychosocial & Spiritual Assessment    Palliative Care Outcomes       Patient Active Problem List   Diagnosis Date Noted  . Palliative care encounter   . Protein-calorie malnutrition, severe 07/11/2015  . Cerebral thrombosis with cerebral infarction 07/11/2015  . UTI (lower urinary tract infection) 07/09/2015  . Acute encephalopathy 07/09/2015  . Dehydration 07/09/2015  . Renal failure, acute on chronic (HCC) 07/09/2015  . Anemia 07/09/2015  . Dementia 07/09/2015  . Tobacco abuse 07/09/2015  . Hypoglycemia 07/09/2015    Palliative  Care Assessment & Plan   Patient Profile: 82 y.o. male with past medical history of anemia, reported dementia, tobacco abuse admitted on 07/09/2015 with confusion. Treatment for UTI, acute ischemic infarct of left midbrain, and delirium on top of progressing dementia. Now continuing unexplained hypoglycemic even with D10 infusion.     Recommendations/Plan:  Agitation/delirium: Agree with low dose haldol prn. Continue nicotine patch. This seems improved today.   Fall risk: Seems to want to get out of bed to use bathroom and to smoke.    Code Status:    Code Status Orders        Start     Ordered   07/13/15 1203  Do not attempt resuscitation (DNR)   Continuous    Question Answer Comment  In the event of cardiac or respiratory ARREST Do not call a "code blue"   In the event of cardiac or respiratory ARREST Do not perform Intubation, CPR, defibrillation or ACLS   In the event of cardiac or respiratory ARREST Use medication by any route, position, wound care, and other measures to relive pain and suffering. May use oxygen, suction and manual treatment of airway obstruction as needed for comfort.      07/13/15 1202    Code Status History    Date Active Date Inactive Code Status Order ID Comments User Context   07/09/2015  7:48 PM 07/13/2015 12:02 PM Full Code [id number]  Elease Etienne, MD Inpatient       Prognosis:  Difficult to say. Prognosis is very poor and with continued hypoglycemia and decline could even be days to weeks.   Discharge Planning:  To Be Determined.  Family hopeful for SNF rehab.   Thank you for allowing the Palliative Medicine Team to assist in the care of this patient.   Time In: 1030 Time Out: 1130 Total Time 60min Prolonged Time Billed  no       Greater than 50%  of this time was spent counseling and coordinating care related to the above assessment and plan.  Roy Cole, Roy Borntreger C, NP  Please contact Palliative Medicine Team phone at [phone] for questions  and concerns.

## 2015-07-14 NOTE — Progress Notes (Signed)
SLP Cancellation Note  Patient Details Name: Roy Cole MRN: [id number] DOB: Mar 05, 1924   Cancelled treatment:       Reason Eval/Treat Not Completed: Patient at procedure or test/unavailable; attempted x2   ADAMS,PAT, M.S., CCC-SLP 07/14/2015, 12:53 PM

## 2015-07-14 NOTE — Care Management Important Message (Signed)
Important Message  Patient Details  Name: Roy Cole MRN: [id number] Date of Birth: 08/31/1923   Medicare Important Message Given:  Yes    [name] 07/14/2015, 1:19 PM

## 2015-07-14 NOTE — Progress Notes (Signed)
PROGRESS NOTE  Rica Recordsnderson Trainer  ZHY:[id number] DOB: February 28, 1924  DOA: 07/09/2015 PCP: No primary care provider on file.  Outpatient Specialists:  None  Brief Narrative:  82 y.o. ?  lives with spouse, prior independent of activities of daily living,  PMH of anemia,  reported dementia,  tobacco abuse,  presented ]07/09/15 with complaints of confusion, possible fall with small skin tears, hypoglycemia.   Evaluation in ED revealed acute on chronic kidney disease, UTI, disconjugate gaze/rule out stroke.   Admitted to telemetry. Neurology consulted.  Despite treatment for UTI, continued to be quite confused and intermittently agitated Palliative care was consulted for goals of care.  Recurrent hypoglycemia/ongoing evaluation. Seems to be improving-slowly  Mental status better on 5/9 than it has been since admission.   Assessment & Plan:   Principal Problem:   UTI (lower urinary tract infection) Active Problems:   Acute encephalopathy   Dehydration   Renal failure, acute on chronic (HCC)   Anemia   Dementia   Tobacco abuse   Hypoglycemia   Protein-calorie malnutrition, severe   Cerebral thrombosis with cerebral infarction   Palliative care encounter   UTI - Treated empirically with IV Rocephin.  Unfortunately UC suggestive of contamination. Blood cultures 2: Negative to date. - A second urine culture shows coagulase negative staph-unclear significance--WOul not Rx at present - Continue Rocephin (completed 4 days). Monitor clinically.  Dehydration with New-onset hyponatremia 2/2 tpo Hypotonic D10 used for support of Hypoglycemia - IV fluids.  -Dehydration resolved.  Acute toxic metabolic encephalopathy 2/2 to neuroglycopenia - Secondary to acute medical illness (UTI, dehydration, acute kidney injury, stroke) complicating underlying dementia - Ongoing confusion and intermittent agitation are better  - Patient did not seem agitated this morning and was actually answering a few  questions and following some instructions. -Follow B1 level. -Started thiamine given history of malnutrition.  Acute on chronic kidney disease - Baseline creatinine not known. Presented with creatinine of 1.7. - Hydrated with IV fluids and follow BMP. - Renal ultrasound: No right-sided hydronephrosis. Left kidney not visualized. - Creatinine has plateaued in the 1.4-1.5 range-probably his baseline. -stop labs until 5/12 if still here  Anemia, chronic, possibly chronic disease versus iron deficiency - Anemia panel results as below. Hemoglobin stable.  Dementia with behavioral disturbance - As per spouse, felt to have dementia for the last year and gradually declining. - Mental status worse than baseline. Normally he is calm. - B-12: 398. TSH 1.952. - Periodic agitation. Use Haldol when necessary rather than benzodiazepines. Monitor. - Mental status slightly better on 5/9 and actually ate a meal on 07/14/15 and had a stool per wife  Acute left brain stroke/Disconjugate gaze - Neurology was consulted and recommended MRI brain - MRI: Acute, nonhemorrhagic infarct in the left paramedian midbrain. - MRA brain: Not performed due to patient's inability to cooperate. - Carotid Dopplers: 1-39 percent ICA stenosis bilaterally. Vertebral arteries are patent with antegrade flow. - LDL: 59. - Hemoglobin A1c: 4.9. - 2-D echo: LVEF 55-60 percent and grade 1 diastolic dysfunction. - Start aspirin 325 MG daily. Started on dysphagia 1 diet and thin liquids per speech therapy recommendations. - Sinus rhythm on telemetry. - Neurology signed off. Follow-up with Dr. Pearlean BrownieSethi in 2 months.  Hypoglycemia, recurrent - ? Secondary to poor oral intake, renal insufficiency,? Other etiology.Encourage oral intake. Monitor - On 5/7 night, CBG was 48 but simultaneous BMP showed glucose of 87.? Inaccurate fingerstick testing. - Ongoing poor oral intake which is improving 07/14/2015 - A1c: 4.9. Working  up hypoglycemia:  Cortisol: 17.8, proinsulin/insulin ratio: Pending, C-peptide mildly elevated: 4.5, beta hydroxybutyric acid <0.05 and sulfonylurea hypoglycemic panel pending.  Tobacco abuse - Nicotine patch  Possible fall at home with skin tears - PT and OT evaluation. Recommend SNF.  Hypertension - Allow for permissive hypertension given recent acute stroke.  Right kidney superior pole lesion, seen on renal ultrasound - Elective outpatient workup as deemed necessary.   SEVERE MALNUTRITION in the context of Chronic illness as evidenced by Severe muscle/fat loss, Body mass index is 17.79 kg/(m^2). -Management per dietitian. Started thiamine.  Elevated troponin - Unsure why this was performed. No chest pain reported.? Related to renal insufficiency and recent stroke. Echo this admission showed normal EF.  ? Hypoxia - On 5/7 night, oxygen saturation was reported as 79% on room air. However no comment regarding patient's respiratory status. Chest x-ray with atelectasis. Follow clinically and wean off oxygen as tolerated - Seems to have resolved. Oxygen saturation 99% on room air today.  DVT prophylaxis: Lovenox Code Status: DO NOT RESUSCITATE Family Communication: long discussion with wife, patient and Ms Jimmey Ralph of Palliative today 07/14/2015 Disposition Plan:SNF ~ 24-48 hours-[facility] when medically stable.    Consultants:   Neurology-signed off  Palliative care team  Procedures:   None  Antimicrobials:   IV Rocephin 5/5 > 5/9   Subjective: Non verbal but foll comman Grimace to touch, withdraw to pain Ate a meal per wife  Objective:  Filed Vitals:   07/13/15 1456 07/13/15 2220 07/14/15 0537 07/14/15 0622  BP: 127/75 126/68 133/81   Pulse: 83 89 84   Temp: 97.4 F (36.3 C) 99 F (37.2 C) 98 F (36.7 C)   TempSrc: Axillary Oral    Resp: 16 18 18    Height:      Weight:    53.071 kg (117 lb)  SpO2: 99% 100% 100%     Intake/Output Summary (Last 24 hours) at 07/14/15  1123 Last data filed at 07/14/15 0600  Gross per 24 hour  Intake   1835 ml  Output    226 ml  Net   1609 ml   Filed Weights   07/09/15 1440 07/14/15 0622  Weight: 54.432 kg (120 lb) 53.071 kg (117 lb)    Examination:  General exam:  frail, cachectic, elderly male, chronically ill looking, lying down comfortably in bed. Does not look restless or fidgety. Calm and comfortable. Respiratory system: Clear to auscultation/Poor inspiratory effort.  Cardiovascular system: S1 & S2 heard, RRR.Marland Kitchen No JVD, murmurs, rubs, gallops or clicks. No pedal edema. Gastrointestinal system: Abdomen is nondistended, soft and nontender. No organomegaly or masses felt. Normal bowel sounds heard. Central nervous system: Alert and oriented to self. Follows simple instructions -  raise arms.  Disconjugate eye movements-seems slightly better compared to admission. Extremities: Symmetric 5 x 5 power. Mittens over bilateral hands. Skin: did not examine skin today Psychiatry:  Unable to assess due to confusion.     Data Reviewed: I have personally reviewed following labs and imaging studies  CBC:  Recent Labs Lab 07/09/15 1523 07/10/15 0335 07/11/15 2143  WBC 9.3 9.4 8.4  HGB 10.9* 10.3* 10.1*  HCT 33.6* 32.7* 30.8*  MCV 87.3 87.2 85.1  PLT 167 170 156   Basic Metabolic Panel:  Recent Labs Lab 07/10/15 0335 07/11/15 0608 07/11/15 2038 07/11/15 2143 07/14/15 0545  NA 140 137 135 133* 130*  K 4.1 4.0 3.7 3.4* 4.4  CL 109 105 103 101 99*  CO2 20* 20* 22 23  21*  GLUCOSE 108* 135* 87 72 110*  BUN CREATININE 1.53* 1.52* 1.50* 1.46* 1.47*  CALCIUM 8.5* 8.3* 8.4* 8.1* 8.1*   GFR: Estimated Creatinine Clearance: 25.6 mL/min (by C-G formula based on Cr of 1.47). Liver Function Tests:  Recent Labs Lab 07/09/15 1523  AST 24  ALT 17  ALKPHOS 59  BILITOT 0.8  PROT 7.0  ALBUMIN 3.5   No results for input(s): LIPASE, AMYLASE in the last 168 hours. No results for input(s): AMMONIA  in the last 168 hours. Coagulation Profile: No results for input(s): INR, PROTIME in the last 168 hours. Cardiac Enzymes:  Recent Labs Lab 07/11/15 2143  TROPONINI 0.24*   BNP (last 3 results) No results for input(s): PROBNP in the last 8760 hours. HbA1C: No results for input(s): HGBA1C in the last 72 hours. CBG:  Recent Labs Lab 07/13/15 2017 07/14/15 0028 07/14/15 0447 07/14/15 0801 07/14/15 0827  GLUCAP 95 122* 113* 68 95   Lipid Profile: No results for input(s): CHOL, HDL, LDLCALC, TRIG, CHOLHDL, LDLDIRECT in the last 72 hours. Thyroid Function Tests: No results for input(s): TSH, T4TOTAL, FREET4, T3FREE, THYROIDAB in the last 72 hours. Anemia Panel: No results for input(s): VITAMINB12, FOLATE, FERRITIN, TIBC, IRON, RETICCTPCT in the last 72 hours. Urine analysis:    Component Value Date/Time   COLORURINE YELLOW 07/09/2015 1527   APPEARANCEUR CLOUDY* 07/09/2015 1527   LABSPEC 1.012 07/09/2015 1527   PHURINE 5.5 07/09/2015 1527   GLUCOSEU NEGATIVE 07/09/2015 1527   HGBUR NEGATIVE 07/09/2015 1527   BILIRUBINUR NEGATIVE 07/09/2015 1527   KETONESUR NEGATIVE 07/09/2015 1527   PROTEINUR NEGATIVE 07/09/2015 1527   NITRITE POSITIVE* 07/09/2015 1527   LEUKOCYTESUR SMALL* 07/09/2015 1527    Recent Results (from the past 240 hour(s))  Blood culture (routine x 2)     Status: None (Preliminary result)   Collection Time: 07/09/15  3:23 PM  Result Value Ref Range Status   Specimen Description BLOOD LEFT FOREARM  Final   Special Requests BOTTLES DRAWN AEROBIC AND ANAEROBIC 5CC  Final   Culture NO GROWTH 4 DAYS  Final   Report Status PENDING  Incomplete  Urine culture     Status: Abnormal   Collection Time: 07/09/15  3:27 PM  Result Value Ref Range Status   Specimen Description URINE, CLEAN CATCH  Final   Special Requests Normal  Final   Culture MULTIPLE SPECIES PRESENT, SUGGEST RECOLLECTION (A)  Final   Report Status 07/11/2015 FINAL  Final  Blood culture (routine x  2)     Status: None (Preliminary result)   Collection Time: 07/09/15  5:20 PM  Result Value Ref Range Status   Specimen Description BLOOD LEFT WRIST  Final   Special Requests BOTTLES DRAWN AEROBIC AND ANAEROBIC 5CC  Final   Culture NO GROWTH 4 DAYS  Final   Report Status PENDING  Incomplete  Urine culture     Status: Abnormal (Preliminary result)   Collection Time: 07/09/15 10:06 PM  Result Value Ref Range Status   Specimen Description URINE, RANDOM  Final   Special Requests Normal  Final   Culture (A)  Final    >=100,000 COLONIES/mL STAPHYLOCOCCUS SPECIES (COAGULASE NEGATIVE) UNABLE TO OBTAIN SENSITIVITIES SENDING TO REFERENCE LAB FOR TESTING    Report Status PENDING  Incomplete         Radiology Studies: No results found.      Scheduled Meds: . aspirin  325 mg Oral Daily  . enoxaparin (LOVENOX) injection  30 mg Subcutaneous Q24H  . feeding supplement (ENSURE ENLIVE)  237 mL Oral BID BM  . multivitamin with minerals  1 tablet Oral Daily  . nicotine  14 mg Transdermal Q2000  . thiamine  100 mg Oral Daily   Continuous Infusions:     LOS: 5 days    Time spent: 35 minutes. Long discussion with wife in room   Pleas Koch, MD [facility] [phone]

## 2015-07-14 NOTE — Progress Notes (Signed)
Patient's family expressing concerns with low bed and potential for further falls and injury to patient.

## 2015-07-14 NOTE — Progress Notes (Signed)
Nutrition Follow-up  DOCUMENTATION CODES:   Underweight, Severe malnutrition in context of chronic illness  INTERVENTION:    -Continue Ensure Enlive po BID, each supplement provides 350 kcal and 20 grams of protein  -Continue Magic cup BID with meals, each supplement provides 290 kcal and 9 grams of protein  -Continue MVI with minerals   NUTRITION DIAGNOSIS:   Inadequate oral intake related to chronic illness as evidenced by severe depletion of muscle mass, severe depletion of body fat.  Ongoing  GOAL:   Patient will meet greater than or equal to 90% of their needs  Ongoing  MONITOR:   PO intake, Supplement acceptance, Labs  REASON FOR ASSESSMENT:   Malnutrition Screening Tool    ASSESSMENT:   82 y.o. male, married, lives with spouse, independent of activities of daily living, has not seen a physician in decades, PMH of anemia, reported dementia, tobacco abuse, [facility] with complaints of confusion, possible fall with small skin tears, hypoglycemia.  Pt underwent BSE by SLP. Pt has been downgraded to a dysphagia 1 diet with nectar thick liquids.   Intake remains poor; noted PO: 0-75% per doc flowsheet records, averaging 25% intake or less. Pt has been refusing Ensure supplements recently. Suspect continued nutritional decline given dementia, malnutrition, and overall poor prognosis.  Palliative care team following pt; pt with poor prognosis, but family is hopeful for improvement. Plan to d/c to SNF with palliative care follow-up.   Labs reviewed: Na: 131, CBGS: 68-143.   Diet Order:  DIET - DYS 1 Room service appropriate?: Yes; Fluid consistency:: Nectar Thick  Skin:  Reviewed, no issues  Last BM:  07/14/15  Height:   Ht Readings from Last 1 Encounters:  07/09/15 5\' 8"  (1.727 m)    Weight:   Wt Readings from Last 1 Encounters:  07/14/15 117 lb (53.071 kg)    Ideal Body Weight:  70 kg  BMI:  Body mass index is 17.79  kg/(m^2).  Estimated Nutritional Needs:   Kcal:  1750-1950 (32-36 kcal/kg bw)  Protein:  60-70 g Pro (1.1-1.3 g/kg bw)  Fluid:  1.8-2 liters  EDUCATION NEEDS:   No education needs identified at this time  Georga Stys A. Mayford KnifeWilliams, RD, LDN, CDE Pager: [phone] After hours Pager: [phone]

## 2015-07-14 NOTE — Progress Notes (Signed)
Was the fall witnessed: No  Patient condition before and after the fall: Pt. Obtained hematoma to forehead, neurologically at baseline.   Patient's reaction to the fall: Complaint of head hurting, ice applied  Name of the doctor that was notified including date and time: Samtani, 07/14/15 1428  Any interventions and vital signs: Ice applied and vital signs repeated every 15 minutes for 2 hours, neuro checks every 30 minutes then hourly for four hours, then every 2 hours to complete 24 hours.

## 2015-07-15 LAB — GLUCOSE, CAPILLARY
GLUCOSE-CAPILLARY: 56 mg/dL — AB (ref 65–99)
GLUCOSE-CAPILLARY: 73 mg/dL (ref 65–99)
GLUCOSE-CAPILLARY: 77 mg/dL (ref 65–99)
GLUCOSE-CAPILLARY: 99 mg/dL (ref 65–99)
GLUCOSE-CAPILLARY: 87 mg/dL (ref 65–99)
Glucose-Capillary: 22 mg/dL — CL (ref 65–99)
Glucose-Capillary: 88 mg/dL (ref 65–99)

## 2015-07-15 LAB — BASIC METABOLIC PANEL
ANION GAP: 15 (ref 5–15)
BUN: 28 mg/dL — ABNORMAL HIGH (ref 6–20)
CO2: 18 mmol/L — ABNORMAL LOW (ref 22–32)
Calcium: 8.5 mg/dL — ABNORMAL LOW (ref 8.9–10.3)
Chloride: 98 mmol/L — ABNORMAL LOW (ref 101–111)
Creatinine, Ser: 1.97 mg/dL — ABNORMAL HIGH (ref 0.61–1.24)
GFR, EST AFRICAN AMERICAN: 33 mL/min — AB (ref >60)
GFR, EST NON AFRICAN AMERICAN: 28 mL/min — AB (ref >60)
GLUCOSE: 93 mg/dL (ref 65–99)
Potassium: 5.3 mmol/L — ABNORMAL HIGH (ref 3.5–5.1)
Sodium: 131 mmol/L — ABNORMAL LOW (ref 135–145)

## 2015-07-15 MED ORDER — GLUCOSE 4 G PO CHEW: 1.0000 | CHEWABLE_TABLET | Freq: Once | ORAL | Status: DC

## 2015-07-15 MED ORDER — HALOPERIDOL LACTATE 5 MG/ML IJ SOLN
1.0000 mg | INTRAMUSCULAR | Status: AC
  Administered 2015-07-15: 1 mg via INTRAMUSCULAR

## 2015-07-15 MED ORDER — DEXTROSE 50 % IV SOLN: 25.0000 mL | Freq: Once | INTRAVENOUS | Status: DC

## 2015-07-15 MED ORDER — GLUCOSE-VITAMIN C 4-6 GM-MG PO CHEW
1.0000 | CHEWABLE_TABLET | Freq: Once | ORAL | Status: AC
  Administered 2015-07-15: 1 via ORAL
  Filled 2015-07-15: qty 1

## 2015-07-15 MED ORDER — HALOPERIDOL LACTATE 5 MG/ML IJ SOLN
1.0000 mg | INTRAMUSCULAR | Status: AC
  Administered 2015-07-15: 1 mg via INTRAMUSCULAR
  Filled 2015-07-15: qty 1

## 2015-07-15 NOTE — Progress Notes (Signed)
Occupational Therapy Treatment Patient Details Name: Roy Cole MRN: [id number] DOB: Sep 02, 1923 Today's Date: 07/15/2015    History of present illness Roy Cole is a 82 y.o. male, lives with spouse, independent of activities of daily living, PMH of anemia, reported dementia, tobacco abuse, presented to [facility] ED on 07/09/15 with complaints of confusion.  Patient's spouse and 2 sons at bedside provided history. At baseline, patient apparently has memory deficits and intermittent confusion, gradually declining over the last 1 year, independent of activities of daily living and ambulates at home without help of assistance. MRI infarct in the left paramedian midbrain   OT comments  Pt restless, grasping non purposefully at items coming in contact with his hands. Did not open eyes throughout session. Verbalizations largely unintelligible. Pt requiring 2 person total assist for bed mobility. Sat EOB with min guard to max assist x 10 minutes. Pt incontinent of bowel and bladder. Assisted NT with clean up.  Pt resting comfortably at end of session.  Follow Up Recommendations  SNF;Supervision/Assistance - 24 hour    Equipment Recommendations       Recommendations for Other Services      Precautions / Restrictions Precautions Precautions: Fall Precaution Comments: dementia Restrictions Weight Bearing Restrictions: No       Mobility Bed Mobility Overal bed mobility: +2 for physical assistance;Needs Assistance Bed Mobility: Supine to Sit;Sit to Supine     Supine to sit: +2 for physical assistance;Total assist Sit to supine: +2 for physical assistance;Total assist   General bed mobility comments: assist for all aspects  Transfers Overall transfer level: Needs assistance Equipment used: 2 person hand held assist Transfers: Sit to/from Stand Sit to Stand: +2 physical assistance;Total assist         General transfer comment: height of bed elevated, performed partial stand x 1     Balance Overall balance assessment: Needs assistance Sitting-balance support: Feet supported Sitting balance-Leahy Scale: Poor Sitting balance - Comments: pushing toward R initially, sat 10 minutes with min guard to max assist Postural control: Right lateral lean   Standing balance-Leahy Scale: Zero Standing balance comment: flexed posture                   ADL Overall ADL's : Needs assistance/impaired     Grooming: Wash/dry face;Sitting;Total assistance Grooming Details (indicate cue type and reason): attempted hand under hand         Upper Body Dressing : Total assistance;Bed level Upper Body Dressing Details (indicate cue type and reason): to don gown Lower Body Dressing: Total assistance;Bed level Lower Body Dressing Details (indicate cue type and reason): to change adult diaper     Toileting- Clothing Manipulation and Hygiene: Total assistance;Bed level                Vision                 Additional Comments: pt with edema around eyes, kept eyes closed entire visit   Perception     Praxis      Cognition   Behavior During Therapy: Restless Overall Cognitive Status: Impaired/Different from baseline Area of Impairment: Following commands;Safety/judgement;Attention;Problem solving   Current Attention Level: Focused    Following Commands:  (not following commands) Safety/Judgement: Decreased awareness of safety;Decreased awareness of deficits   Problem Solving: Decreased initiation;Difficulty sequencing;Requires verbal cues;Requires tactile cues General Comments: restless, grasping at clothing and linens, speech largely unintelligible, difficult to redirect    Extremity/Trunk Assessment  Exercises     Shoulder Instructions       General Comments      Pertinent Vitals/ Pain       Pain Assessment: Faces Faces Pain Scale: No hurt  Home Living                                          Prior  Functioning/Environment              Frequency Min 2X/week     Progress Toward Goals  OT Goals(current goals can now be found in the care plan section)  Progress towards OT goals: Not progressing toward goals - comment (restless, not opening eyes)  Acute Rehab OT Goals Patient Stated Goal: unable to state Time For Goal Achievement: 07/26/15 Potential to Achieve Goals: Fair  Plan Discharge plan remains appropriate    Co-evaluation    PT/OT/SLP Co-Evaluation/Treatment: Yes Reason for Co-Treatment: For patient/therapist safety;Necessary to address cognition/behavior during functional activity   OT goals addressed during session: ADL's and self-care      End of Session Equipment Utilized During Treatment: Gait belt   Activity Tolerance Patient tolerated treatment well   Patient Left in bed;with call bell/phone within reach;with bed alarm set   Nurse Communication  (aware of BM, NT assisted with clean up)        Time: [id number] OT Time Calculation (min): 31 min  Charges: OT General Charges $OT Visit: 1 Procedure OT Treatments $Therapeutic Activity: 8-22 mins  Evern BioMayberry, Jaggar Benko Lynn 07/15/2015, 10:56 AM  [phone]

## 2015-07-15 NOTE — Progress Notes (Signed)
Hypoglycemic Event  CBG: 56  Treatment: glucose tab with apple sauce  Symptoms: Nervous/irritable/combative/restless  Follow-up CBG: Time: 05:30 CBG Result: 73  Possible Reasons for Event: Inadequate meal intake  Comments/MD notified: MD notified  [name], Otisha Spickler

## 2015-07-15 NOTE — Progress Notes (Signed)
PROGRESS NOTE  Roy Cole  ZOX:[id number] DOB: 1924-01-25  DOA: 07/09/2015 PCP: No primary care provider on file.  Outpatient Specialists:  None  Brief Narrative:  82 y.o. ?  lives with spouse, prior independent of activities of daily living,  PMH of anemia,  reported dementia,  tobacco abuse,  presented ]07/09/15 with complaints of confusion, possible fall with small skin tears, hypoglycemia.   Evaluation in ED revealed acute on chronic kidney disease, UTI, disconjugate gaze/rule out stroke.   Admitted to telemetry. Neurology consulted.  Despite treatment for UTI, continued to be quite confused and intermittently agitated Palliative care was consulted for goals of care.  Recurrent hypoglycemia/ongoing evaluation.   Mental status better on 5/9 than it has been since admission.   Assessment & Plan:   Principal Problem:   UTI (lower urinary tract infection) Active Problems:   Acute encephalopathy   Dehydration   Renal failure, acute on chronic (HCC)   Anemia   Dementia   Tobacco abuse   Hypoglycemia   Protein-calorie malnutrition, severe   Cerebral thrombosis with cerebral infarction   Palliative care encounter   UTI - Treated empirically with IV Rocephin.  Unfortunately UC suggestive of contamination. Blood cultures 2: Negative to date. - A second urine culture shows coagulase negative staph-unclear significance--WOul not Rx at present - Continue Rocephin (completed 4 days). Monitor clinically.  Fall with hematoma -07/14/2015 patient had an unwitnessed fall where he fell from standing up position straight onto his 400 -Have patient had CT of the head which did not show intracranial bleed -Patient remains intermittently confused  ? Tick bite -tick found in patient's underwear -The patient has had no exposure to the outdoors status known of -Continue doxycycline to complete a total of 14 days  Superficial phlebitis right upper extremity -Continue doxycycline  100 twice a day-see above discussion regarding duration -Improving  Dehydration with New-onset hyponatremia 2/2 tpo Hypotonic D10 used for support of Hypoglycemia - IV fluids were discontinued as patient's wife did not wish further IV fluid resuscitation or IVs -Dehydration resolved. Acute on chronic kidney disease - Baseline creatinine not known. Presented with creatinine of 1.7. - Hydrated with IV fluids and follow BMP. - Renal ultrasound: No right-sided hydronephrosis. Left kidney not visualized. - Creatinine has plateaued in the 1.4---> 1.9 range since we have stopped IV fluids -We will need to see how he looks in the morning in terms of labs   Acute toxic metabolic encephalopathy 2/2 to neuroglycopenia - Secondary to acute medical illness (UTI, dehydration, acute kidney injury, stroke) complicating underlying dementia - Ongoing confusion and intermittent agitation are better  - Patient has intermittent confusion -Started thiamine given history of malnutrition.  Anemia, chronic, possibly chronic disease versus iron deficiency - Anemia panel results as below. Hemoglobin stable.  Dementia with behavioral disturbance - As per spouse, felt to have dementia for the last year and gradually declining. - Mental status worse than baseline. Normally he is calm. - B-12: 398. TSH 1.952. - Periodic agitation. Use Haldol when necessary rather than benzodiazepines. Monitor. - Mental status slightly better on 5/9 and actually ate a meal on 07/14/15 and had a stool per wife  Acute left brain stroke/Disconjugate gaze - Neurology was consulted and recommended MRI brain - MRI: Acute, nonhemorrhagic infarct in the left paramedian midbrain. - MRA brain: Not performed due to patient's inability to cooperate. - Carotid Dopplers: 1-39 percent ICA stenosis bilaterally. Vertebral arteries are patent with antegrade flow. - LDL: 59. - Hemoglobin A1c: 4.9. -  2-D echo: LVEF 55-60 percent and grade 1  diastolic dysfunction. - Start aspirin 325 MG daily. Started on dysphagia 1 diet and thin liquids per speech therapy recommendations. - Sinus rhythm on telemetry. - Neurology signed off. Follow-up with Dr. Pearlean Brownie in 2 months.  Hypoglycemia, recurrent - ? Secondary to poor oral intake, renal insufficiency,? Other etiology.Encourage oral intake. Monitor - On 5/11 patient's blood sugar 22 -Patient tolerating breakfast and lunch but no definite -Nursing encouraged to supplement evening meal or replaced with a meal replacement drink - Ongoing poor oral intake which is improving 07/15/2015 - A1c: 4.9. Working up hypoglycemia: Cortisol: 17.8, proinsulin/insulin ratio: Pending, C-peptide mildly elevated: 4.5, beta hydroxybutyric acid <0.05 and sulfonylurea hypoglycemic panel pending.  Tobacco abuse - Nicotine patch  Possible fall at home with skin tears - PT and OT evaluation. Recommend SNF.  Hypertension - Allow for permissive hypertension given recent acute stroke.  Right kidney superior pole lesion, seen on renal ultrasound - Elective outpatient workup as deemed necessary.  SEVERE MALNUTRITION in the context of Chronic illness as evidenced by Severe muscle/fat loss, Body mass index is 17.79 kg/(m^2). -Management per dietitian. Started thiamine.  Elevated troponin - Unsure why this was performed. No chest pain reported.? Related to renal insufficiency and recent stroke. Echo this admission showed normal EF.  ? Hypoxia - On 5/7 night, oxygen saturation was reported as 79% on room air. However no comment regarding patient's respiratory status. Chest x-ray with atelectasis. Follow clinically and wean off oxygen as tolerated - Seems to have resolved. Oxygen saturation 99% on room air today.  DVT prophylaxis: Lovenox Code Status: DO NOT RESUSCITATE Family Communication: long discussion with wife, patient and Ms Jimmey Ralph of Palliative today 07/15/2015  Unclear dispo Disposition Plan:SNF ~ 48  hours-[facility] when medically stabilized Still hypoglycemic and Goals of care not solidified.    Consultants:   Neurology-signed off  Palliative care team  Procedures:   None  Antimicrobials:   IV Rocephin 5/5 > 5/9   Subjective: Non verbal but foll comman Grimace to touch, withdraw to pain Ate a meal per wife  Objective:  Filed Vitals:   07/14/15 1802 07/14/15 2005 07/14/15 2343 07/15/15 0422  BP: 148/104 159/114 150/75 118/85  Pulse: 96 75 83 108  Temp: 98.1 F (36.7 C)  97.6 F (36.4 C) 97.6 F (36.4 C)  TempSrc:   Oral Oral  Resp: Height:      Weight:      SpO2:    90%    Intake/Output Summary (Last 24 hours) at 07/15/15 1652 Last data filed at 07/15/15 1300  Gross per 24 hour  Intake    120 ml  Output      0 ml  Net    120 ml   Filed Weights   07/09/15 1440 07/14/15 0622  Weight: 54.432 kg (120 lb) 53.071 kg (117 lb)    Examination:  General exam:  frail, cachectic, elderly male, chronically ill looking, lying down comfortably in bed. Does not look restless or fidgety. Slept most of the day hemtoma to head appears to be slowly receding into eyelids Respiratory system: Clear to auscultation/Poor inspiratory effort.  Cardiovascular system: S1 & S2 heard, RRR.Marland Kitchen No JVD, murmurs, rubs, gallops or clicks. No pedal edema. Gastrointestinal system: Abdomen is nondistended, soft and nontender. No organomegaly or masses felt. Normal bowel sounds heard. Central nervous system: Alert and oriented to self. Follows simple instructions -  raise arms.  Disconjugate eye movements-seems slightly better  compared to admission. Extremities: Symmetric 5 x 5 power. Mittens over bilateral hands. Skin: Phlebitis of RUE is imporving and wound appreciated todaty Psychiatry:  Unable to assess due to confusion.     Data Reviewed: I have personally reviewed following labs and imaging studies  CBC:  Recent Labs Lab 07/09/15 1523 07/10/15 0335 07/11/15 2143    WBC 9.3 9.4 8.4  HGB 10.9* 10.3* 10.1*  HCT 33.6* 32.7* 30.8*  MCV 87.3 87.2 85.1  PLT 167 170 156   Basic Metabolic Panel:  Recent Labs Lab 07/11/15 0608 07/11/15 2038 07/11/15 2143 07/14/15 0545 07/15/15 0850  NA 137 135 133* 130* 131*  K 4.0 3.7 3.4* 4.4 5.3*  CL 105 103 101 99* 98*  CO2 20* 22 23 21* 18*  GLUCOSE 135* 87 72 110* 93  BUN 15 13 13 14  28*  CREATININE 1.52* 1.50* 1.46* 1.47* 1.97*  CALCIUM 8.3* 8.4* 8.1* 8.1* 8.5*   GFR: Estimated Creatinine Clearance: 19.1 mL/min (by C-G formula based on Cr of 1.97). Liver Function Tests:  Recent Labs Lab 07/09/15 1523  AST 24  ALT 17  ALKPHOS 59  BILITOT 0.8  PROT 7.0  ALBUMIN 3.5   No results for input(s): LIPASE, AMYLASE in the last 168 hours. No results for input(s): AMMONIA in the last 168 hours. Coagulation Profile: No results for input(s): INR, PROTIME in the last 168 hours. Cardiac Enzymes:  Recent Labs Lab 07/11/15 2143  TROPONINI 0.24*   BNP (last 3 results) No results for input(s): PROBNP in the last 8760 hours. HbA1C: No results for input(s): HGBA1C in the last 72 hours. CBG:  Recent Labs Lab 07/15/15 0416 07/15/15 0417 07/15/15 0552 07/15/15 0831 07/15/15 1155  GLUCAP 22* 56* 73 77 99   Lipid Profile: No results for input(s): CHOL, HDL, LDLCALC, TRIG, CHOLHDL, LDLDIRECT in the last 72 hours. Thyroid Function Tests: No results for input(s): TSH, T4TOTAL, FREET4, T3FREE, THYROIDAB in the last 72 hours. Anemia Panel: No results for input(s): VITAMINB12, FOLATE, FERRITIN, TIBC, IRON, RETICCTPCT in the last 72 hours. Urine analysis:    Component Value Date/Time   COLORURINE YELLOW 07/09/2015 1527   APPEARANCEUR CLOUDY* 07/09/2015 1527   LABSPEC 1.012 07/09/2015 1527   PHURINE 5.5 07/09/2015 1527   GLUCOSEU NEGATIVE 07/09/2015 1527   HGBUR NEGATIVE 07/09/2015 1527   BILIRUBINUR NEGATIVE 07/09/2015 1527   KETONESUR NEGATIVE 07/09/2015 1527   PROTEINUR NEGATIVE 07/09/2015 1527    NITRITE POSITIVE* 07/09/2015 1527   LEUKOCYTESUR SMALL* 07/09/2015 1527    Recent Results (from the past 240 hour(s))  Blood culture (routine x 2)     Status: None   Collection Time: 07/09/15  3:23 PM  Result Value Ref Range Status   Specimen Description BLOOD LEFT FOREARM  Final   Special Requests BOTTLES DRAWN AEROBIC AND ANAEROBIC 5CC  Final   Culture NO GROWTH 5 DAYS  Final   Report Status 07/14/2015 FINAL  Final  Urine culture     Status: Abnormal   Collection Time: 07/09/15  3:27 PM  Result Value Ref Range Status   Specimen Description URINE, CLEAN CATCH  Final   Special Requests Normal  Final   Culture MULTIPLE SPECIES PRESENT, SUGGEST RECOLLECTION (A)  Final   Report Status 07/11/2015 FINAL  Final  Blood culture (routine x 2)     Status: None   Collection Time: 07/09/15  5:20 PM  Result Value Ref Range Status   Specimen Description BLOOD LEFT WRIST  Final   Special Requests BOTTLES DRAWN  AEROBIC AND ANAEROBIC 5CC  Final   Culture NO GROWTH 5 DAYS  Final   Report Status 07/14/2015 FINAL  Final  Urine culture     Status: Abnormal (Preliminary result)   Collection Time: 07/09/15 10:06 PM  Result Value Ref Range Status   Specimen Description URINE, RANDOM  Final   Special Requests Normal  Final   Culture (A)  Final    >=100,000 COLONIES/mL STAPHYLOCOCCUS SPECIES (COAGULASE NEGATIVE) UNABLE TO OBTAIN SENSITIVITIES SENDING TO REFERENCE LAB FOR TESTING    Report Status PENDING  Incomplete         Radiology Studies: Ct Head Wo Contrast  07/14/2015  CLINICAL DATA:  Larey Seat and struck forehead.  No LOC.  Hematoma. EXAM: CT HEAD WITHOUT CONTRAST TECHNIQUE: Contiguous axial images were obtained from the base of the skull through the vertex without intravenous contrast. COMPARISON:  MR brain 07/10/2015. FINDINGS: LEFT paramedian midbrain infarct, displays hypoattenuation best seen on the axial CT. No hemorrhage, mass lesion, or extra-axial fluid. Moderate atrophy. Prominent  ventricles related to central brain substance loss. Hypoattenuation in the white matter, likely chronic microvascular ischemic change. Large midline frontal scalp hematoma. No underlying skull fracture. No sinus air-fluid level. Negative mastoids. No orbital hematoma. Dense lenticular opacities. IMPRESSION: Recent brainstem infarct displays LEFT paramedian hypoattenuation in the midbrain. No complicating hemorrhage. No skull fracture or posttraumatic intracranial hemorrhage. Atrophy with chronic microvascular ischemic change. Electronically Signed   By: Elsie Stain M.D.   On: 07/14/2015 15:57        Scheduled Meds: . aspirin  325 mg Oral Daily  . doxycycline  100 mg Oral Q12H  . enoxaparin (LOVENOX) injection  30 mg Subcutaneous Q24H  . feeding supplement (ENSURE ENLIVE)  237 mL Oral BID BM  . multivitamin with minerals  1 tablet Oral Daily  . nicotine  14 mg Transdermal Q2000  . sodium chloride  1 g Oral BID WC  . thiamine  100 mg Oral Daily   Continuous Infusions:     LOS: 6 days    Time spent: 35 minutes. Long discussion with wife in room   Pleas Koch, MD [facility] [phone]

## 2015-07-15 NOTE — Progress Notes (Signed)
Physical Therapy Treatment Patient Details Name: Roy Cole MRN: [id number] DOB: 1923-03-25 Today's Date: 07/15/2015    History of Present Illness Roy Cole is a 82 y.o. male, lives with spouse, independent of activities of daily living, PMH of anemia, reported dementia, tobacco abuse, presented to [facility] ED on 07/09/15 with complaints of confusion.  Patient's spouse and 2 sons at bedside provided history. At baseline, patient apparently has memory deficits and intermittent confusion, gradually declining over the last 1 year, independent of activities of daily living and ambulates at home without help of assistance. MRI infarct in the left paramedian midbrain    PT Comments    Patient performing mostly non purposeful movements and not following commands this session requiring max/total A +2 for transfers and balance. Pt with unintelligible speech and demonstrated grasp reflex with L > R. Pt with very swollen eyes and did not open eyes throughout session. Continue to follow acutely.   Follow Up Recommendations  SNF     Equipment Recommendations  None recommended by PT    Recommendations for Other Services       Precautions / Restrictions Precautions Precautions: Fall Precaution Comments: dementia Restrictions Weight Bearing Restrictions: No    Mobility  Bed Mobility Overal bed mobility: +2 for physical assistance;Needs Assistance Bed Mobility: Supine to Sit;Sit to Supine     Supine to sit: +2 for physical assistance;Total assist Sit to supine: +2 for physical assistance;Total assist   General bed mobility comments: pt not following commands or assisting  Transfers Overall transfer level: Needs assistance Equipment used: 2 person hand held assist Transfers: Sit to/from Stand Sit to Stand: +2 physical assistance;Total assist         General transfer comment: max multimodal cues given for hand placement with pt's hands placed on therapists' arms to stand; pt with  grasp reflex L > R and did not assist or grasp therapsit's arm with R hand; bilat knees blocked; pt unable to achieve erect posture  Ambulation/Gait                 Stairs            Wheelchair Mobility    Modified Rankin (Stroke Patients Only) Modified Rankin (Stroke Patients Only) Pre-Morbid Rankin Score: No symptoms Modified Rankin: Severe disability     Balance Overall balance assessment: Needs assistance Sitting-balance support: No upper extremity supported;Feet supported Sitting balance-Leahy Scale: Poor Sitting balance - Comments: pushing toward R initially, sat 10 minutes with min guard to max assist     Standing balance-Leahy Scale: Zero                      Cognition Arousal/Alertness: Awake/alert Behavior During Therapy: Restless Overall Cognitive Status: Impaired/Different from baseline Area of Impairment: Following commands;Safety/judgement;Attention;Problem solving   Current Attention Level: Focused   Following Commands:  (not following commands) Safety/Judgement: Decreased awareness of safety;Decreased awareness of deficits   Problem Solving: Decreased initiation;Difficulty sequencing;Requires verbal cues;Requires tactile cues General Comments: restless, grasping at clothing and linens, speech largely unintelligible, difficult to redirect; incontinent of BM and unaware    Exercises      General Comments        Pertinent Vitals/Pain Pain Assessment: Faces Faces Pain Scale: No hurt    Home Living                      Prior Function            PT Goals (current  goals can now be found in the care plan section) Acute Rehab PT Goals Patient Stated Goal: unable to state Progress towards PT goals: Not progressing toward goals - comment    Frequency  Min 3X/week    PT Plan Current plan remains appropriate    Co-evaluation PT/OT/SLP Co-Evaluation/Treatment: Yes Reason for Co-Treatment: For patient/therapist  safety;Necessary to address cognition/behavior during functional activity PT goals addressed during session: Mobility/safety with mobility;Balance       End of Session Equipment Utilized During Treatment: Gait belt Activity Tolerance: Patient tolerated treatment well Patient left: in bed;with call bell/phone within reach;with bed alarm set     Time: [id number] PT Time Calculation (min) (ACUTE ONLY): 31 min  Charges:  $Therapeutic Activity: 8-22 mins                    G Codes:      Derek MoundKellyn R Asuzena Weis Akeen Ledyard, PTA Pager: [phone]   07/15/2015, 2:43 PM

## 2015-07-15 NOTE — Progress Notes (Signed)
Tick found attached under patients scrotum. Area assessed to be clean, dry, and intact with no redness or swelling. MD and family notified.

## 2015-07-15 NOTE — Progress Notes (Signed)
Daily Progress Note   Patient Name: Roy Cole       Date: 07/15/2015 DOB: 04/04/1923  Age: 82 y.o. MRN#: [id number] Attending Physician: Rhetta Mura, MD Primary Care Physician: No primary care provider on file. Admit Date: 07/09/2015  Reason for Consultation/Follow-up: Establishing goals of care  Subjective: Roy Cole is more lethargic today. Mrs. Moralez is at bedside. We have a long discussion regarding ongoing hypoglycemic episodes and agitation. She is very upset about his fall yesterday and I assure her that we are upset about this as well. We discussed that his prognosis is likely very poor and possible weeks or less. She is hopeful that he will live to see their only grandchild be born expected in the next ~3 weeks. She struggles to find balance between comfort and life prolongation.   We spend much time discussing options for disposition including SNF rehab (unable to receive hospice at [facility] under rehab benefit), home with hospice, or hospice facility. I do believe with his functional decline, hypoglycemia, and symptom of agitation he would qualify for hospice facility. She asks my recommendation and I explain that this depends on what her goals are for him. I do believe if it were my family I would prefer hospice facility and she appreciated the recommendation. I do explain that family has to be emotionally ready and accepting of this next step as well.   All questions/concerns addressed. We have discussed extensively regarding my concern for decline that I fear we are unable to reverse. She has struggled with acknowledgement that he will not return to baseline and she has been very hopeful for much recovery. Will need further discussion.   Length of Stay: 6  Current  Medications: Scheduled Meds:  . aspirin  325 mg Oral Daily  . doxycycline  100 mg Oral Q12H  . enoxaparin (LOVENOX) injection  30 mg Subcutaneous Q24H  . feeding supplement (ENSURE ENLIVE)  237 mL Oral BID BM  . multivitamin with minerals  1 tablet Oral Daily  . nicotine  14 mg Transdermal Q2000  . sodium chloride  1 g Oral BID WC  . thiamine  100 mg Oral Daily    Continuous Infusions:    PRN Meds: acetaminophen **OR** acetaminophen, albuterol, haloperidol lactate, RESOURCE THICKENUP CLEAR  Physical Exam  Constitutional: He appears lethargic. He appears  cachectic.  Severe muscle wasting  Cardiovascular: Normal rate.   Pulmonary/Chest: No accessory muscle usage. No tachypnea. No respiratory distress.  Abdominal: Normal appearance.  Neurological: He appears lethargic.            Vital Signs: BP 118/85 mmHg  Pulse 108  Temp(Src) 97.6 F (36.4 C) (Oral)  Resp 20  Ht 5\' 8"  (1.727 m)  Wt 53.071 kg (117 lb)  BMI 17.79 kg/m2  SpO2 90% SpO2: SpO2: 90 % O2 Device: O2 Device: Not Delivered O2 Flow Rate: O2 Flow Rate (L/min): 4 L/min  Intake/output summary:   Intake/Output Summary (Last 24 hours) at 07/15/15 1624 Last data filed at 07/15/15 1300  Gross per 24 hour  Intake    120 ml  Output      0 ml  Net    120 ml   LBM: Last BM Date: 07/14/15 Baseline Weight: Weight: 54.432 kg (120 lb) Most recent weight: Weight: 53.071 kg (117 lb)       Palliative Assessment/Data:    Flowsheet Rows        Most Recent Value   Intake Tab    Referral Department  Hospitalist   Unit at Time of Referral  Med/Surg Unit   Palliative Care Primary Diagnosis  Neurology   Date Notified  07/11/15   Palliative Care Type  New Palliative care   Reason for referral  Clarify Goals of Care   Date of Admission  07/09/15   Date first seen by Palliative Care  07/12/15   # of days Palliative referral response time  1 Day(s)   # of days IP prior to Palliative referral  2   Clinical Assessment     Psychosocial & Spiritual Assessment    Palliative Care Outcomes       Patient Active Problem List   Diagnosis Date Noted  . Palliative care encounter   . Protein-calorie malnutrition, severe 07/11/2015  . Cerebral thrombosis with cerebral infarction 07/11/2015  . UTI (lower urinary tract infection) 07/09/2015  . Acute encephalopathy 07/09/2015  . Dehydration 07/09/2015  . Renal failure, acute on chronic (HCC) 07/09/2015  . Anemia 07/09/2015  . Dementia 07/09/2015  . Tobacco abuse 07/09/2015  . Hypoglycemia 07/09/2015    Palliative Care Assessment & Plan   Patient Profile: 82 y.o. male with past medical history of anemia, reported dementia, tobacco abuse admitted on 07/09/2015 with confusion. Treatment for UTI, acute ischemic infarct of left midbrain, and delirium on top of progressing dementia. Now continuing hypoglycemic events.     Recommendations/Plan:  Agitation/delirium: Agree with low dose haldol prn. Continue nicotine patch. Could consider low dose Zyprexa??   Fall risk: Seems to want to get out of bed to use bathroom and to smoke.  Low bed, bed alarm on, near nursing station, floor padding beside bed.   Code Status:    Code Status Orders        Start     Ordered   07/13/15 1203  Do not attempt resuscitation (DNR)   Continuous    Question Answer Comment  In the event of cardiac or respiratory ARREST Do not call a "code blue"   In the event of cardiac or respiratory ARREST Do not perform Intubation, CPR, defibrillation or ACLS   In the event of cardiac or respiratory ARREST Use medication by any route, position, wound care, and other measures to relive pain and suffering. May use oxygen, suction and manual treatment of airway obstruction as needed for comfort.  07/13/15 1202    Code Status History    Date Active Date Inactive Code Status Order ID Comments User Context   07/09/2015  7:48 PM 07/13/2015 12:02 PM Full Code [id number]  Elease EtienneAnand D Hongalgi, MD Inpatient        Prognosis:  Prognosis is very poor and with continued hypoglycemia and decline could even be days to weeks.   Discharge Planning:  To Be Determined. Family hopeful for SNF rehab but considering hospice options.   Thank you for allowing the Palliative Medicine Team to assist in the care of this patient.   Time In: 1350 Time Out: 1500 Total Time 70min Prolonged Time Billed  yes      Greater than 50%  of this time was spent counseling and coordinating care related to the above assessment and plan.  Ulice BoldParker, Shelsey Rieth C, NP  Please contact Palliative Medicine Team phone at [phone] for questions and concerns.

## 2015-07-15 NOTE — Progress Notes (Signed)
Speech Language Pathology Treatment: Dysphagia  Patient Details Name: Roy Cole MRN: [id number] DOB: 1923-10-25 Today's Date: 07/15/2015 Time: 0815-0829 SLP Time Calculation (min) (ACUTE ONLY): 14 min  Assessment / Plan / Recommendation Clinical Impression  SLP visited pt at am meal and provided total assist for feeding. Pt kept eyes closed and remained very restless with limited awareness of environment or POs. Tactile cues and hand over hand assist occasionally improved pt awareness to open his mouth for spoon or cup, but in 80% of trials pt had no anticipation of bolus. This increases his risk of aspiration, as does prolonged mastication and oral holding. Feeding the pt is laborious and intake is likely to be poor. Recommend pt continue current diet with careful hand feeding. Will f/u to provide education to family, though they have never been present during SLP tx sessions.    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 Suspected multifactorial confusion including change in environement(this alone has been enough to cause him to be confused in the past), new stroke, and UTI.       SLP Plan  Continue with current plan of care     Recommendations  Diet recommendations: Dysphagia 1 (puree);Nectar-thick liquid Liquids provided via: Straw;Cup Medication Administration: Crushed with puree Supervision: Full supervision/cueing for compensatory strategies;Staff to assist with self feeding Compensations: Slow rate;Small sips/bites Postural Changes and/or Swallow Maneuvers: Seated upright 90 degrees             Oral Care Recommendations: Oral care BID Follow up Recommendations: Skilled Nursing facility;24 hour supervision/assistance;Home health SLP Plan: Continue with current plan of care     GO               Memorial HospitalBonnie Elener Custodio, MA CCC-SLP [phone]  Roy Cole, Roy Cole 07/15/2015, 9:33 AM

## 2015-07-15 NOTE — Progress Notes (Signed)
Patient's family has requested I don't do neuro checks q4h because they want him to sleep. Will continue to monitor pt and perform neuro checks if patient is awake.

## 2015-07-15 NOTE — Significant Event (Signed)
Late entry  Mrs Lawerance BachBurns and I had a long conversation ~ 21:45 07/14/15 regarding the patient and circumstances leading up to the patient's fall. I was present for the fall and patient developed a hematoma to the front of forehead Ct scan is neg for intra-cranial bleed.  I did address the fact that, while preventable, falls in elderly patients with sub-acute delirium are not entirely preventable in any setting-be it Hospital, Nursing Facility or home.  I actively listened to Mrs Lawerance BachBurns as she voiced her concerns regarding her husband's supervision on the unit.  I did mention that while I do think Mr Burn's delirium might be slowly clearing, the risk of profound sustained recurrent hypoglycemia in addition to new CVA brain needs to be weighed carefully in mind and I did mention to her that our team would need more than 24 hours to monitor the patient for  a safe disposition and discharge to the next venue.  Mrs. Burn and I will meet again today after 13:30 pm to re-assess  Pleas KochJai Jacques Willingham, MD [facility] [phone]

## 2015-07-16 DIAGNOSIS — R410 Disorientation, unspecified: Secondary | ICD-10-CM

## 2015-07-16 LAB — GLUCOSE, CAPILLARY
GLUCOSE-CAPILLARY: 81 mg/dL (ref 65–99)
GLUCOSE-CAPILLARY: 82 mg/dL (ref 65–99)
GLUCOSE-CAPILLARY: 89 mg/dL (ref 65–99)
Glucose-Capillary: 74 mg/dL (ref 65–99)

## 2015-07-16 LAB — CBC WITH DIFFERENTIAL/PLATELET
BASOS ABS: 0 10*3/uL (ref 0.0–0.1)
BASOS PCT: 0 %
EOS ABS: 0.2 10*3/uL (ref 0.0–0.7)
EOS PCT: 2 %
HCT: 20.6 % — ABNORMAL LOW (ref 39.0–52.0)
HEMOGLOBIN: 7 g/dL — AB (ref 13.0–17.0)
Lymphocytes Relative: 13 %
Lymphs Abs: 1.3 10*3/uL (ref 0.7–4.0)
MCH: 28.6 pg (ref 26.0–34.0)
MCHC: 34 g/dL (ref 30.0–36.0)
MCV: 84.1 fL (ref 78.0–100.0)
Monocytes Absolute: 1.1 10*3/uL — ABNORMAL HIGH (ref 0.1–1.0)
Monocytes Relative: 11 %
NEUTROS PCT: 74 %
Neutro Abs: 7.3 10*3/uL (ref 1.7–7.7)
PLATELETS: 257 10*3/uL (ref 150–400)
RBC: 2.45 MIL/uL — AB (ref 4.22–5.81)
RDW: 13.8 % (ref 11.5–15.5)
WBC: 9.8 10*3/uL (ref 4.0–10.5)

## 2015-07-16 LAB — BASIC METABOLIC PANEL
Anion gap: 12 (ref 5–15)
BUN: 29 mg/dL — AB (ref 6–20)
CALCIUM: 8.4 mg/dL — AB (ref 8.9–10.3)
CHLORIDE: 100 mmol/L — AB (ref 101–111)
CO2: 22 mmol/L (ref 22–32)
CREATININE: 2.16 mg/dL — AB (ref 0.61–1.24)
GFR, EST AFRICAN AMERICAN: 29 mL/min — AB (ref >60)
GFR, EST NON AFRICAN AMERICAN: 25 mL/min — AB (ref >60)
Glucose, Bld: 84 mg/dL (ref 65–99)
Potassium: 4.5 mmol/L (ref 3.5–5.1)
SODIUM: 134 mmol/L — AB (ref 135–145)

## 2015-07-16 MED ORDER — OXYCODONE HCL 20 MG/ML PO CONC: 6.0000 mg | ORAL | Status: AC | PRN

## 2015-07-16 MED ORDER — LORAZEPAM 1 MG PO TABS: 1.0000 mg | ORAL_TABLET | ORAL | Status: DC | PRN

## 2015-07-16 MED ORDER — LORAZEPAM 2 MG/ML IJ SOLN: 1.0000 mg | INTRAMUSCULAR | Status: DC | PRN

## 2015-07-16 MED ORDER — HALOPERIDOL LACTATE 2 MG/ML PO CONC
1.0000 mg | ORAL | Status: DC | PRN
  Filled 2015-07-16: qty 0.5

## 2015-07-16 MED ORDER — LORAZEPAM 2 MG/ML PO CONC: 1.0000 mg | ORAL | Status: AC | PRN

## 2015-07-16 MED ORDER — ASPIRIN EC 325 MG PO TBEC: 325.0000 mg | DELAYED_RELEASE_TABLET | Freq: Every day | ORAL | Status: DC

## 2015-07-16 MED ORDER — OXYCODONE HCL 20 MG/ML PO CONC: 5.0000 mg | ORAL | Status: DC | PRN

## 2015-07-16 MED ORDER — LORAZEPAM 2 MG/ML PO CONC: 1.0000 mg | ORAL | Status: DC | PRN

## 2015-07-16 MED ORDER — ATROPINE SULFATE 1 % OP SOLN
4.0000 [drp] | OPHTHALMIC | Status: DC | PRN
  Filled 2015-07-16: qty 2

## 2015-07-16 NOTE — Progress Notes (Signed)
[facility] Anticipated DC date: 07/16/15 Family notified: Wife Transport by: Sharin MonsPTAR   Per MD [facility]. RN, patient, patient's family, and facility notified of DC. RN given number for report. DC packet on chart. Ambulance transport requested for patient.   CSW signing off.  Cristobal GoldmannNadia Abundio Teuscher, [place]WA Clinical Social Worker [phone]

## 2015-07-16 NOTE — Progress Notes (Signed)
[facility] to be D/C'd Skilled nursing facility per MD order.  Discussed with the patient and all questions fully answered.  VSS, Skin clean, dry and intact without evidence of skin break down, no evidence of skin tears noted. IV catheter discontinued intact. Site without signs and symptoms of complications. Dressing and pressure applied.  An After Visit Summary was printed and given to the patient. Patient received prescription.  D/c education completed with patient/family including follow up instructions, medication list, d/c activities limitations if indicated, with other d/c instructions as indicated by MD - patient able to verbalize understanding, all questions fully answered.   Patient instructed to return to ED, call 911, or call MD for any changes in condition.   Patient escorted via WC, and D/C home via private auto.  Pura SpiceJessica K Edwards 07/16/2015 2:11 PM

## 2015-07-16 NOTE — Progress Notes (Signed)
Daily Progress Note   Patient Name: Roy Cole       Date: 07/16/2015 DOB: 1923-08-31  Age: 82 y.o. MRN#: [id number] Attending Physician: Nita Sells, MD Primary Care Physician: No primary care provider on file. Admit Date: 07/09/2015  Reason for Consultation/Follow-up: Establishing goals of care  Subjective: Roy Cole continues to be lethargic today.  He does not really open Roy eyes nor participate in our encounter. I met with Roy wife at bedside.  We again reviewed options for disposition.  She reports that her main focus moving forward is on ensuring Roy comfort. She understands that he has had changes in Roy labs including increasing creatinine and decreasing hemoglobin. He also is less interactive today. She reports he did try to take a few small bites of Roy bedside tray, but he is really not taking in much nutrition and hydration.  She again emphasized that she does not want to pursue further IV therapy for hydration or blood transfusion.  We again discussed hospice and she reports that this is how she feels her Cole will best be served moving forward. She would like to explore options for residential hospice. I told her I would ask the social worker to begin process of referral.   Length of Stay: 7  Current Medications: Scheduled Meds:  . aspirin  325 mg Oral Daily  . doxycycline  100 mg Oral Q12H  . feeding supplement (ENSURE ENLIVE)  237 mL Oral BID BM  . multivitamin with minerals  1 tablet Oral Daily  . nicotine  14 mg Transdermal Q2000  . sodium chloride  1 g Oral BID WC  . thiamine  100 mg Oral Daily    Continuous Infusions:    PRN Meds: acetaminophen **OR** acetaminophen, albuterol, atropine, haloperidol, LORazepam **OR** LORazepam **OR** LORazepam,  oxyCODONE **OR** oxyCODONE, RESOURCE THICKENUP CLEAR  Physical Exam  Constitutional: He appears lethargic. He appears cachectic.  Severe muscle wasting  Cardiovascular: Normal rate.   Pulmonary/Chest: No accessory muscle usage. No tachypnea. No respiratory distress.  Abdominal: Normal appearance.  Neurological: He appears lethargic.            Vital Signs: BP 139/66 mmHg  Pulse 72  Temp(Src) 97.8 F (36.6 C) (Oral)  Resp 18  Ht 5' 8"  (1.727 m)  Wt 49.442 kg (109 lb)  BMI 16.58  kg/m2  SpO2 100% SpO2: SpO2: 100 % O2 Device: O2 Device: Not Delivered O2 Flow Rate: O2 Flow Rate (L/min): 4 L/min  Intake/output summary:   Intake/Output Summary (Last 24 hours) at 07/16/15 1138 Last data filed at 07/15/15 1300  Gross per 24 hour  Intake     60 ml  Output      0 ml  Net     60 ml   LBM: Last BM Date: 07/15/15 Baseline Weight: Weight: 54.432 kg (120 lb) Most recent weight: Weight: 49.442 kg (109 lb)       Palliative Assessment/Data:    Flowsheet Rows        Most Recent Value   Intake Tab    Referral Department  Hospitalist   Unit at Time of Referral  Med/Surg Unit   Palliative Care Primary Diagnosis  Neurology   Date Notified  07/11/15   Palliative Care Type  New Palliative care   Reason for referral  Clarify Goals of Care   Date of Admission  07/09/15   Date first seen by Palliative Care  07/12/15   # of days Palliative referral response time  1 Day(s)   # of days IP prior to Palliative referral  2   Clinical Assessment    Psychosocial & Spiritual Assessment    Palliative Care Outcomes       Patient Active Problem List   Diagnosis Date Noted  . Palliative care encounter   . Protein-calorie malnutrition, severe 07/11/2015  . Cerebral thrombosis with cerebral infarction 07/11/2015  . UTI (lower urinary tract infection) 07/09/2015  . Acute encephalopathy 07/09/2015  . Dehydration 07/09/2015  . Renal failure, acute on chronic (HCC) 07/09/2015  . Anemia  07/09/2015  . Dementia 07/09/2015  . Tobacco abuse 07/09/2015  . Hypoglycemia 07/09/2015    Palliative Care Assessment & Plan   Patient Profile: 82 y.o. male with past medical history of anemia, reported dementia, tobacco abuse admitted on 07/09/2015 with confusion. Treatment for UTI, acute ischemic infarct of left midbrain, and delirium on top of progressing dementia. Now continuing hypoglycemic events.     Recommendations/Plan:  Agitation/delirium:  He no longer has IV access. I added on Haldol 1 mg every 2 hours as needed. I think he will need continued aggressive management and titration of this moving forward.  Pain/shortness of breath: Currently not an issue. Oxycodone as needed  Anxiety: With Roy delirium, would preferentially treat him with Haldol for any agitation. I did add on Ativan as needed as well in case it becomes necessary.  Fall risk: Had been trying to get out of bed to use bathroom and to smoke.  Low bed, bed alarm on, near nursing station, floor padding beside bed. I hope this is lower risk today as he is largely somnolent throughout my encounter with him and Roy wife.  Code Status:    Code Status Orders        Start     Ordered   07/13/15 1203  Do not attempt resuscitation (DNR)   Continuous    Question Answer Comment  In the event of cardiac or respiratory ARREST Do not call a "code blue"   In the event of cardiac or respiratory ARREST Do not perform Intubation, CPR, defibrillation or ACLS   In the event of cardiac or respiratory ARREST Use medication by any route, position, wound care, and other measures to relive pain and suffering. May use oxygen, suction and manual treatment of airway obstruction as needed for comfort.  07/13/15 1202    Code Status History    Date Active Date Inactive Code Status Order ID Comments User Context   07/09/2015  7:48 PM 07/13/2015 12:02 PM Full Code [id number]  Modena Jansky, MD Inpatient       Prognosis:  Mr.  Cole condition has continued decline.  There is no plan for further IV hydration nor transfusions. He has had small amounts of by mouth intake. Roy creatinine is rising again and he has a significant drop in Roy hemoglobin from 10.1 on 07/11/2015 to 7.0 on recheck this morning.  He has also been having periods of hypoglycemia with no plans for further checks and correction.  Overall, based upon this, I would anticipate Roy course to be limited to matter of days to weeks. I think, especially in light of Roy acute drop in hemoglobin, he will continue to decompensate and likely die in the next 2 weeks.  He has been having out of control delirium at night.  I made addition of Haldol as needed via sublingual route as he does not have IV access. I think he would benefit from continued management at residential hospice facility as he will need continued titration of medications for comfort, especially uncontrolled delirium.  Discharge Planning:  Hospice facility.  Thank you for allowing the Palliative Medicine Team to assist in the care of this patient.   Time In: 1000 Time Out: 1045 Total Time 45 Prolonged Time Billed no      Greater than 50%  of this time was spent counseling and coordinating care related to the above assessment and plan.  Micheline Rough, MD  Please contact Palliative Medicine Team phone at [phone] for questions and concerns.

## 2015-07-16 NOTE — Discharge Summary (Signed)
Physician Discharge Summary  Roy Cole ZOX:[id number] DOB: [birth year] DOA: 07/09/2015  PCP: No primary care provider on file.  Admit date: 07/09/2015 Discharge date: 07/16/2015  Time spent: 35 minutes  Recommendations for Outpatient Follow-up:  1. Transition to [facility] for EOL care 2. Expected prognosis  Less than 2 weeks  Discharge Diagnoses:  Principal Problem:   UTI (lower urinary tract infection) Active Problems:   Acute encephalopathy   Dehydration   Renal failure, acute on chronic (HCC)   Anemia   Dementia   Tobacco abuse   Hypoglycemia   Protein-calorie malnutrition, severe   Cerebral thrombosis with cerebral infarction   Palliative care encounter   Discharge Condition: gaurded  Diet recommendation: comfort  Filed Weights   07/09/15 1440 07/14/15 0622 07/16/15 0453  Weight: 54.432 kg (120 lb) 53.071 kg (117 lb) 49.442 kg (109 lb)    History of present illness:  82 y.o. ? lives with spouse, prior independent of activities of daily living,   PMH of anemia,   reported dementia,   tobacco abuse,   presented ]07/09/15 with complaints of confusion, possible fall with small skin tears, hypoglycemia.   Evaluation in ED revealed acute on chronic kidney disease, UTI, disconjugate gaze/rule out stroke.  Admitted to telemetry. Neurology consulted.   Despite treatment for UTI, continued to be quite confused and intermittently agitated  Palliative care was consulted for goals of care.   Recurrent hypoglycemia/ongoing evaluation.   Acute anemia of blood loss after superficial phlebitis, [place] [facility] with central forehead hematoma Long discussions q day with family who wanted comfort trajectory and delineated GOC  Decision made to trasnfer to [facility]  Expect less than 2 weeks survival  Hard script Morphine and ativan given  Discharge Exam: Filed Vitals:   07/15/15 2232 07/16/15 0450  BP: 121/53 139/66  Pulse: 85 72  Temp: 98 F (36.7 C) 97.8 F  (36.6 C)  Resp: 16 18   Somnolent Not making much sense  General: eomi ncat Cardiovascular: s1 s2 no m/r/g Respiratory: clear no added sound  Discharge Instructions   Discharge Instructions    Ambulatory referral to Neurology    Complete by:  As directed   Please schedule post stroke follow up in 2 months.     Diet - low sodium heart healthy    Complete by:  As directed      Increase activity slowly    Complete by:  As directed           Current Discharge Medication List    START taking these medications   Details  LORazepam (ATIVAN) 2 MG/ML concentrated solution Place 0.5 mLs (1 mg total) under the tongue every 4 (four) hours as needed for anxiety. Qty: 30 mL, Refills: 0    oxyCODONE (ROXICODONE INTENSOL) 20 MG/ML concentrated solution Place 0.3 mLs (6 mg total) under the tongue every 2 (two) hours as needed for moderate pain (or dyspnea). Qty: 30 mL, Refills: 0       No Known Allergies Follow-up Information    Follow up with SETHI,PRAMOD, MD In 2 months.   Specialties:  Neurology, Radiology   Why:  Stroke Clinic, Office will call you with appointment date & time   Contact information:   [address] [place] [address] [phone]        The results of significant diagnostics from this hospitalization (including imaging, microbiology, ancillary and laboratory) are listed below for reference.    Significant Diagnostic Studies: Dg Chest  2 View  07/09/2015  CLINICAL DATA:  Weakness and altered behavior.  Questionable fall EXAM: CHEST  2 VIEW COMPARISON:  None. FINDINGS: There is scarring in the left base. There is no edema or consolidation. Heart size and pulmonary vascularity are normal. No adenopathy. No pneumothorax. No acute fracture evident. IMPRESSION: Scarring left base. No edema or consolidation. No apparent pneumothorax. Electronically Signed   By: Bretta Bang III M.D.   On: 07/09/2015 15:56   Dg Lumbar Spine Complete  07/09/2015   CLINICAL DATA:  Pain following fall EXAM: LUMBAR SPINE - COMPLETE 4+ VIEW COMPARISON:  None. FINDINGS: Frontal, lateral, spot lumbosacral lateral, and bilateral oblique views were obtained. There are 5 non-rib-bearing lumbar type vertebral bodies. There is lumbar dextroscoliosis with rotatory component. There is anterior wedging of the L1 and L2 vertebral bodies, age uncertain. No other fractures are identified. There is no spondylolisthesis. There is moderate disc space narrowing at L2-3 and L3-4. There is facet osteoarthritic change at L3-4, L4-5, and L5-S1 bilaterally. IMPRESSION: Age uncertain anterior wedging of the L1 and L2 vertebral bodies. Scoliosis with osteoarthritic change at several levels. No spondylolisthesis. Electronically Signed   By: Bretta Bang III M.D.   On: 07/09/2015 15:58   Ct Head Wo Contrast  07/14/2015  CLINICAL DATA:  Larey Seat and struck forehead.  No LOC.  Hematoma. EXAM: CT HEAD WITHOUT CONTRAST TECHNIQUE: Contiguous axial images were obtained from the base of the skull through the vertex without intravenous contrast. COMPARISON:  MR brain 07/10/2015. FINDINGS: LEFT paramedian midbrain infarct, displays hypoattenuation best seen on the axial CT. No hemorrhage, mass lesion, or extra-axial fluid. Moderate atrophy. Prominent ventricles related to central brain substance loss. Hypoattenuation in the white matter, likely chronic microvascular ischemic change. Large midline frontal scalp hematoma. No underlying skull fracture. No sinus air-fluid level. Negative mastoids. No orbital hematoma. Dense lenticular opacities. IMPRESSION: Recent brainstem infarct displays LEFT paramedian hypoattenuation in the midbrain. No complicating hemorrhage. No skull fracture or posttraumatic intracranial hemorrhage. Atrophy with chronic microvascular ischemic change. Electronically Signed   By: Elsie Stain M.D.   On: 07/14/2015 15:57   Ct Head Wo Contrast  07/09/2015  CLINICAL DATA:  Altered mental  status with fall EXAM: CT HEAD WITHOUT CONTRAST CT CERVICAL SPINE WITHOUT CONTRAST TECHNIQUE: Multidetector CT imaging of the head and cervical spine was performed following the standard protocol without intravenous contrast. Multiplanar CT image reconstructions of the cervical spine were also generated. COMPARISON:  None. FINDINGS: CT HEAD FINDINGS There is mild generalized atrophy. There is no intracranial mass, hemorrhage, extra-axial fluid collection, or midline shift. There is patchy small vessel disease throughout the centra semiovale bilaterally. Elsewhere gray-white compartments appear normal. No acute infarct is evident. The bony calvarium appears intact. The mastoid air cells are clear. No intraorbital lesions are evident. Paranasal sinuses are clear. CT CERVICAL SPINE FINDINGS There is no acute fracture. There is 4 mm of retrolisthesis of C5 on C6. There is minimal retrolisthesis of C6 on C7. There is no other spondylolisthesis. Prevertebral soft tissues and predental space regions are normal. There is marked bony overgrowth along the anterior arch of C2 on the left, consistent with advanced osteoarthritis and possibly representative of residua from prior trauma. No acute lesion is seen in this area. There is moderate facet hypertrophy at multiple levels bilaterally. There is exit foraminal narrowing due to bony hypertrophy at C3-4 on the right, at C4-5 bilaterally, at C5-6 bilaterally, and at C6-7 on the right due to bony hypertrophy. There is  bullous disease in the lung apices bilaterally. There are foci of carotid artery calcification bilaterally. IMPRESSION: CT head: Atrophy with periventricular small vessel disease. No intracranial mass, hemorrhage, or extra-axial fluid collection. No acute infarct evident. CT cervical spine: No acute fracture. Areas of spondylolisthesis at C5-6 and C6-7 are felt to be due to underlying spondylolisthesis. Question old trauma anterior aspect of C2 on the left.  Multifocal arthropathy with exit foraminal narrowing at multiple levels. Foci of carotid artery calcification bilaterally noted. Bullous disease noted in each upper lobe/apex region. Electronically Signed   By: Bretta Bang III M.D.   On: 07/09/2015 16:53   Ct Cervical Spine Wo Contrast  07/09/2015  CLINICAL DATA:  Altered mental status with fall EXAM: CT HEAD WITHOUT CONTRAST CT CERVICAL SPINE WITHOUT CONTRAST TECHNIQUE: Multidetector CT imaging of the head and cervical spine was performed following the standard protocol without intravenous contrast. Multiplanar CT image reconstructions of the cervical spine were also generated. COMPARISON:  None. FINDINGS: CT HEAD FINDINGS There is mild generalized atrophy. There is no intracranial mass, hemorrhage, extra-axial fluid collection, or midline shift. There is patchy small vessel disease throughout the centra semiovale bilaterally. Elsewhere gray-white compartments appear normal. No acute infarct is evident. The bony calvarium appears intact. The mastoid air cells are clear. No intraorbital lesions are evident. Paranasal sinuses are clear. CT CERVICAL SPINE FINDINGS There is no acute fracture. There is 4 mm of retrolisthesis of C5 on C6. There is minimal retrolisthesis of C6 on C7. There is no other spondylolisthesis. Prevertebral soft tissues and predental space regions are normal. There is marked bony overgrowth along the anterior arch of C2 on the left, consistent with advanced osteoarthritis and possibly representative of residua from prior trauma. No acute lesion is seen in this area. There is moderate facet hypertrophy at multiple levels bilaterally. There is exit foraminal narrowing due to bony hypertrophy at C3-4 on the right, at C4-5 bilaterally, at C5-6 bilaterally, and at C6-7 on the right due to bony hypertrophy. There is bullous disease in the lung apices bilaterally. There are foci of carotid artery calcification bilaterally. IMPRESSION: CT head:  Atrophy with periventricular small vessel disease. No intracranial mass, hemorrhage, or extra-axial fluid collection. No acute infarct evident. CT cervical spine: No acute fracture. Areas of spondylolisthesis at C5-6 and C6-7 are felt to be due to underlying spondylolisthesis. Question old trauma anterior aspect of C2 on the left. Multifocal arthropathy with exit foraminal narrowing at multiple levels. Foci of carotid artery calcification bilaterally noted. Bullous disease noted in each upper lobe/apex region. Electronically Signed   By: Bretta Bang III M.D.   On: 07/09/2015 16:53   Mr Brain Wo Contrast  07/10/2015  CLINICAL DATA:  Gaze palsy EXAM: MRI HEAD WITHOUT CONTRAST TECHNIQUE: Multiplanar, multiecho pulse sequences of the brain and surrounding structures were obtained without intravenous contrast. COMPARISON:  Head CT from yesterday FINDINGS: Motion degraded study with late acquired sequences nondiagnostic. Calvarium and upper cervical spine: No focal marrow signal abnormality. Orbits: Negative. Sinuses and Mastoids: Clear. Brain: Linear restricted diffusion left of midline in the midbrain extending from the interpeduncular fossa to cerebral aqua duct. No superimposed hemorrhage. No acute infarct elsewhere. No evidence of major vessel occlusion. There is normal flow related signal loss in the basilar and visible branches. Generalized atrophy. Age congruent small-vessel ischemic gliosis in the periventricular white matter. IMPRESSION: 1. Acute, nonhemorrhagic infarct in the left paramedian midbrain. 2. Generalized atrophy. 3. Motion degraded study with multiple nondiagnostic sequences. Electronically Signed  By: Marnee Spring M.D.   On: 07/10/2015 11:14   US Renal  07/09/2015  CLINICAL DATA:  Patient with acute renal failure. EXAM: RENAL / URINARY TRACT ULTRASOUND COMPLETE COMPARISON:  None. FINDINGS: Right Kidney: Length: 9.2 cm. Normal renal cortical thickness and echogenicity. No  hydronephrosis. Within the superior pole of the right kidney there is a 3.0 x 2.3 x 2.7 cm simple cyst. Additionally within the superior pole there there is an adjacent 2.1 x 1.6 x 2.4 cm hypoechoic lesion. Left Kidney: Not visualized. Bladder: Distended. IMPRESSION: No right-sided hydronephrosis. The left kidney is not able to be visualized. Indeterminate hypoechoic lesion superior pole right kidney. In the nonacute setting, recommend correlation with pre and post contrast-enhanced CT or MRI for definitive characterization and to exclude the possibility of solid mass. Electronically Signed   By: Annia Belt M.D.   On: 07/09/2015 20:44   Dg Chest Port 1 View  07/11/2015  CLINICAL DATA:  Hypoxia EXAM: PORTABLE CHEST 1 VIEW COMPARISON:  07/09/2015 FINDINGS: There is slight patchy opacity in the right base which is new from 07/09/2015 although there is a significantly shallower degree of inspiration today, and this may be atelectatic. No confluent consolidation. No large effusion. Normal pulmonary vasculature. Unchanged aortic tortuosity. IMPRESSION: Shallow inspiration. Mild atelectatic appearing opacity in the right lung base. Electronically Signed   By: Ellery Plunk M.D.   On: 07/11/2015 22:04    Microbiology: Recent Results (from the past 240 hour(s))  Blood culture (routine x 2)     Status: None   Collection Time: 07/09/15  3:23 PM  Result Value Ref Range Status   Specimen Description BLOOD LEFT FOREARM  Final   Special Requests BOTTLES DRAWN AEROBIC AND ANAEROBIC 5CC  Final   Culture NO GROWTH 5 DAYS  Final   Report Status 07/14/2015 FINAL  Final  Urine culture     Status: Abnormal   Collection Time: 07/09/15  3:27 PM  Result Value Ref Range Status   Specimen Description URINE, CLEAN CATCH  Final   Special Requests Normal  Final   Culture MULTIPLE SPECIES PRESENT, SUGGEST RECOLLECTION (A)  Final   Report Status 07/11/2015 FINAL  Final  Blood culture (routine x 2)     Status: None    Collection Time: 07/09/15  5:20 PM  Result Value Ref Range Status   Specimen Description BLOOD LEFT WRIST  Final   Special Requests BOTTLES DRAWN AEROBIC AND ANAEROBIC 5CC  Final   Culture NO GROWTH 5 DAYS  Final   Report Status 07/14/2015 FINAL  Final  Urine culture     Status: Abnormal (Preliminary result)   Collection Time: 07/09/15 10:06 PM  Result Value Ref Range Status   Specimen Description URINE, RANDOM  Final   Special Requests Normal  Final   Culture (A)  Final    >=100,000 COLONIES/mL STAPHYLOCOCCUS SPECIES (COAGULASE NEGATIVE) UNABLE TO OBTAIN SENSITIVITIES SENDING TO REFERENCE LAB FOR TESTING    Report Status PENDING  Incomplete     Labs: Basic Metabolic Panel:  Recent Labs Lab 07/11/15 2038 07/11/15 2143 07/14/15 0545 07/15/15 0850 07/16/15 0717  NA 135 133* 130* 131* 134*  K 3.7 3.4* 4.4 5.3* 4.5  CL 103 101 99* 98* 100*  CO2 22 23 21* 18* 22  GLUCOSE 87 72 110* 93 84  BUN 13 13 14  28* 29*  CREATININE 1.50* 1.46* 1.47* 1.97* 2.16*  CALCIUM 8.4* 8.1* 8.1* 8.5* 8.4*   Liver Function Tests:  Recent Labs Lab 07/09/15  1523  AST 24  ALT 17  ALKPHOS 59  BILITOT 0.8  PROT 7.0  ALBUMIN 3.5   No results for input(s): LIPASE, AMYLASE in the last 168 hours. No results for input(s): AMMONIA in the last 168 hours. CBC:  Recent Labs Lab 07/09/15 1523 07/10/15 0335 07/11/15 2143 07/16/15 0717  WBC 9.3 9.4 8.4 9.8  NEUTROABS  --   --   --  7.3  HGB 10.9* 10.3* 10.1* 7.0*  HCT 33.6* 32.7* 30.8* 20.6*  MCV 87.3 87.2 85.1 84.1  PLT 167 170 156 257   Cardiac Enzymes:  Recent Labs Lab 07/11/15 2143  TROPONINI 0.24*   BNP: BNP (last 3 results)  Recent Labs  07/11/15 2143  BNP 232.7*    ProBNP (last 3 results) No results for input(s): PROBNP in the last 8760 hours.  CBG:  Recent Labs Lab 07/15/15 1651 07/15/15 2116 07/16/15 0030 07/16/15 0501 07/16/15 0842  GLUCAP 87 88 89 81 74       Signed:  Rhetta MuraSAMTANI, JAI-GURMUKH MD    [facility] 07/16/2015, 12:34 PM

## 2015-07-16 NOTE — Progress Notes (Signed)
[facility]

## 2015-07-18 LAB — PROINSULIN/INSULIN RATIO
INSULIN: 9.8 u[IU]/mL
PROINSULIN/INSULIN RATIO: 29 %
PROINSULIN: 19 pmol/L

## 2015-07-20 LAB — URINE CULTURE
Culture: >=100000 — AB
Special Requests: NORMAL

## 2015-07-21 LAB — SULFONYLUREA HYPOGLYCEMICS PANEL, SERUM
Acetohexamide: NEGATIVE ug/mL (ref 20–60)
Chlorpropamide: NEGATIVE ug/mL (ref 75–250)
GLIMEPIRIDE: NEGATIVE ng/mL (ref 80–250)
GLIPIZIDE: NEGATIVE ng/mL (ref 200–1000)
GLYBURIDE: NEGATIVE ng/mL
Nateglinide: NEGATIVE ng/mL
Repaglinide: NEGATIVE ng/mL
TOLBUTAMIDE: NEGATIVE ug/mL (ref 40–100)
Tolazamide: NEGATIVE ug/mL

## 2015-08-05 DEATH — deceased

## 2017-01-02 IMAGING — CT CT HEAD W/O CM
3 of 4 series · 17 of 47 positions shown, 20 images · non-contrast
Comparison: MR brain 07/10/2015.

CLINICAL DATA: Fell and struck forehead.  No LOC.  Hematoma.

EXAM:
CT HEAD WITHOUT CONTRAST
TECHNIQUE: Contiguous axial images were obtained from the base of the skull
through the vertex without intravenous contrast.

[Series 201: head w/o, idose (1) · axial · non-contrast · 0.45mm/px · z∈[+20,+150]mm · 11 of 32 slices shown, 14 images]
[im 3/32  brain]
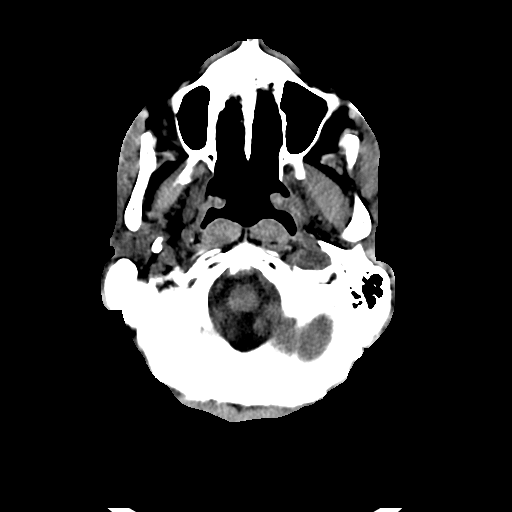
[im 3/32  bone]
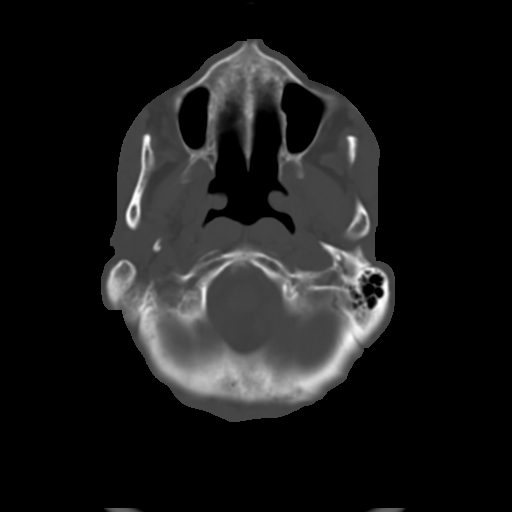
[im 5/32  brain]
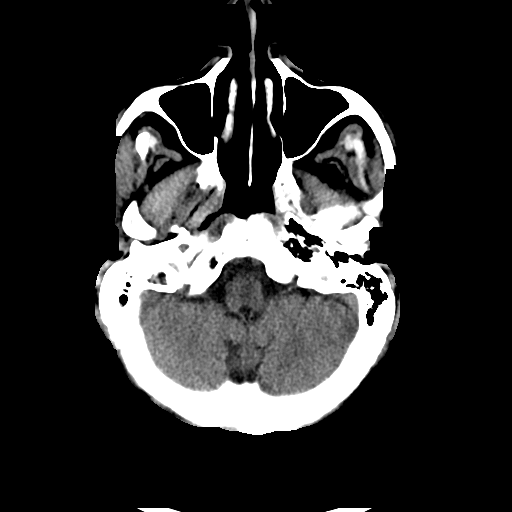
[im 7/32  brain]
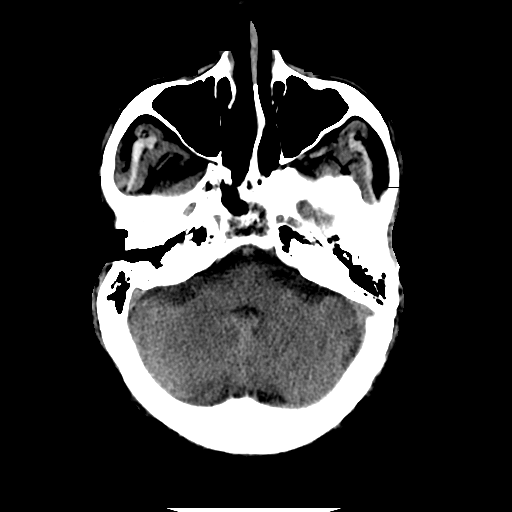
[im 12/32  brain]
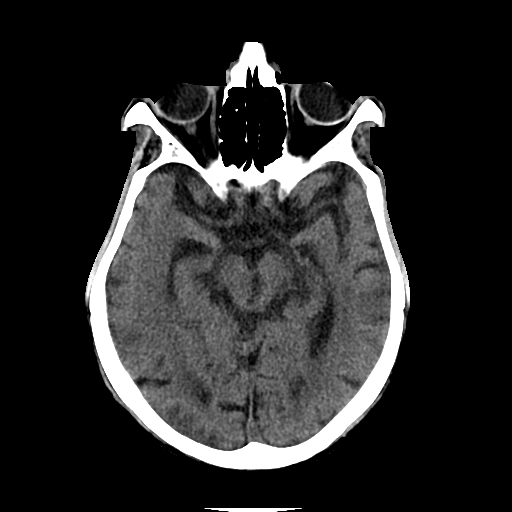
[im 14/32  brain]
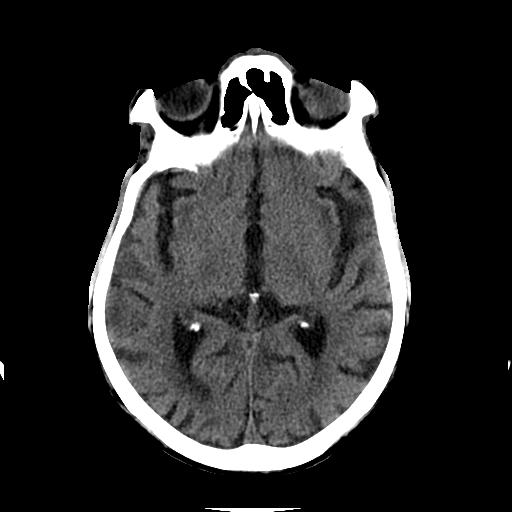
[im 14/32  bone]
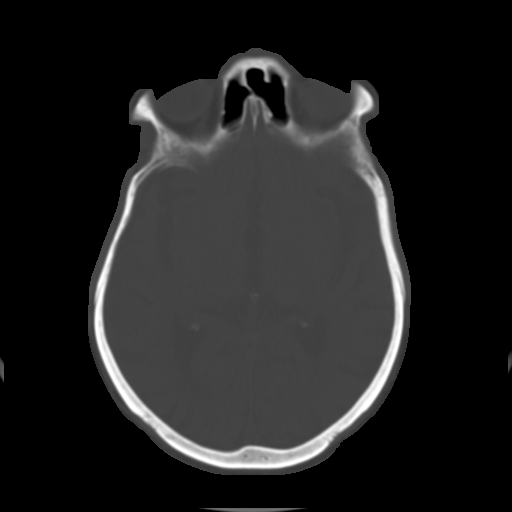
[im 16/32  brain]
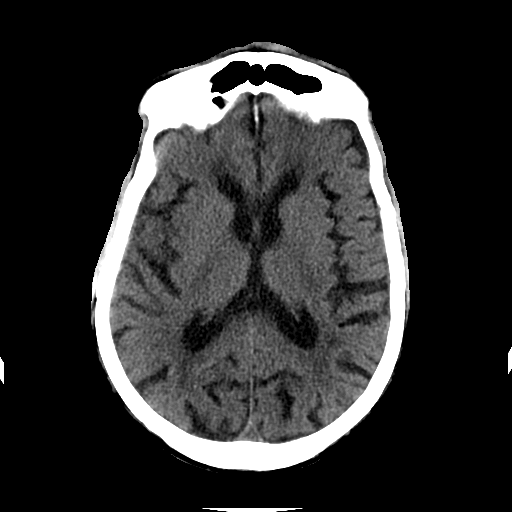
[im 18/32  brain]
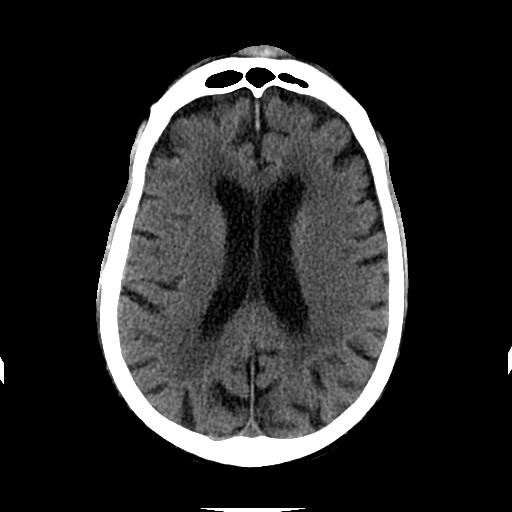
[im 20/32  brain]
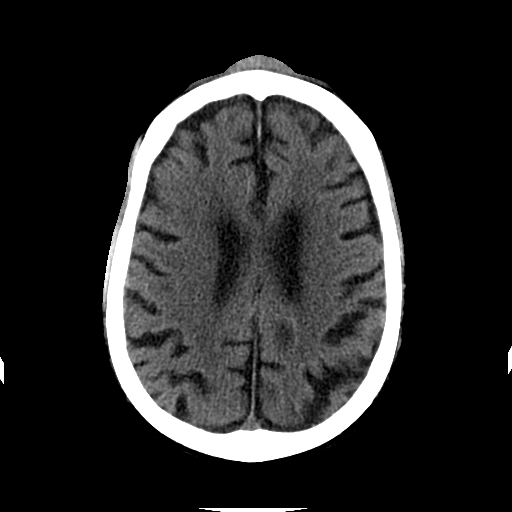
[im 25/32  brain]
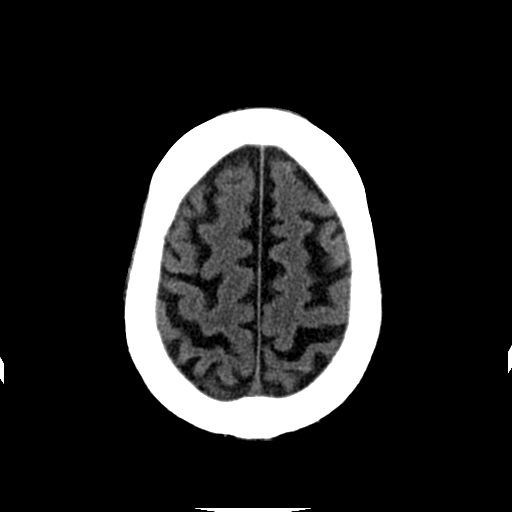
[im 25/32  bone]
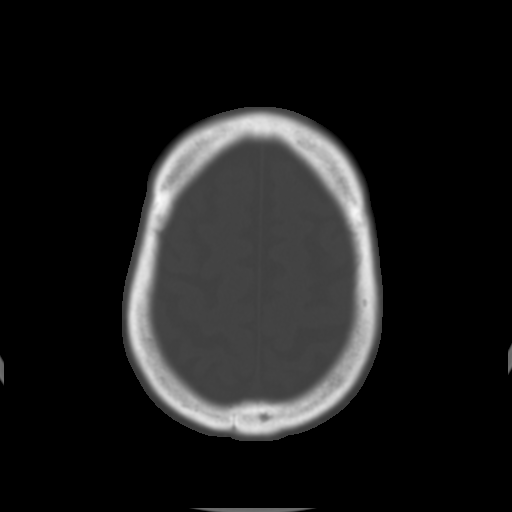
[im 27/32  brain]
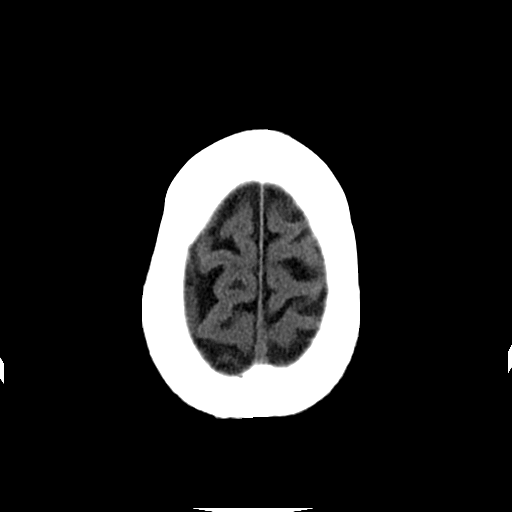
[im 29/32  brain]
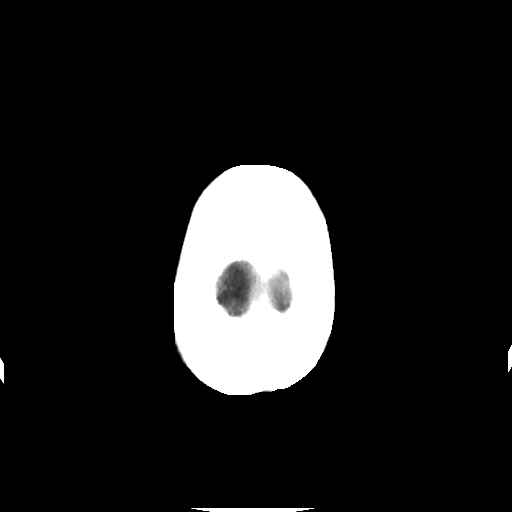

[Series 203: coronal st, idose (1) · coronal · 0.40mm/px · 3 of 76 slices shown]
[im 26/76  brain]
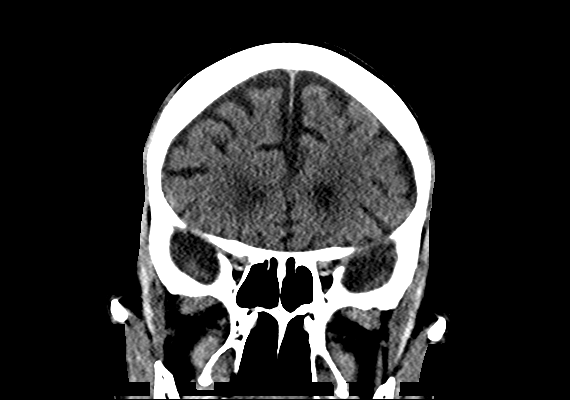
[im 34/76  brain]
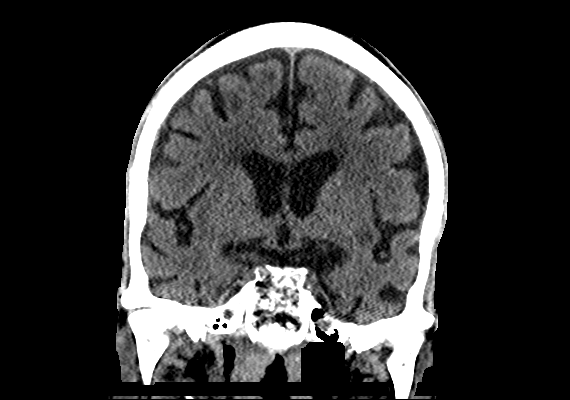
[im 42/76  brain]
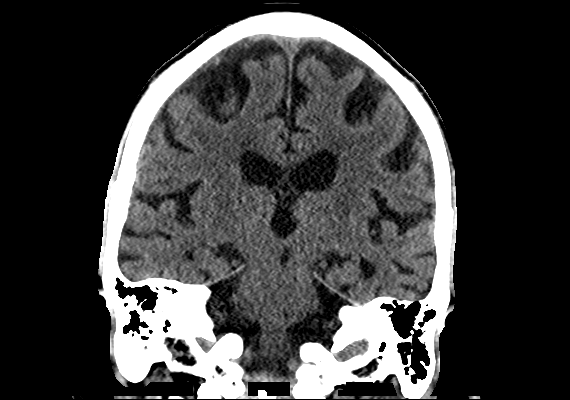

[Series 204: sagittal st, idose (1) · sagittal · 0.40mm/px · 3 of 76 slices shown]
[im 26/76  brain]
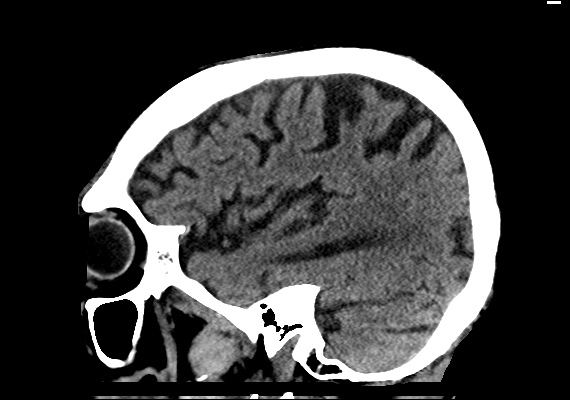
[im 38/76  brain]
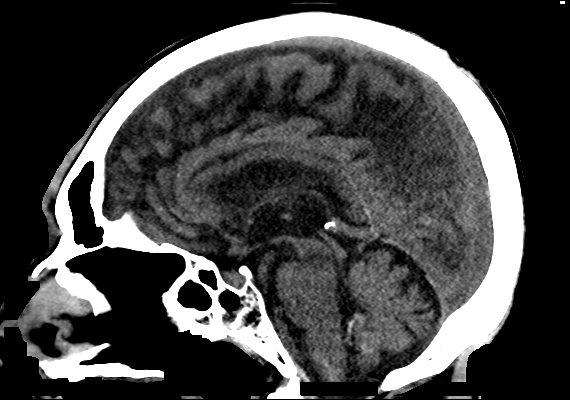
[im 51/76  brain]
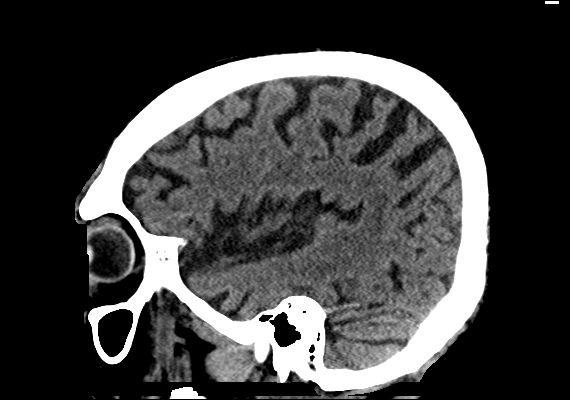

[17 of 47 positions shown; findings below may reference images not displayed]

FINDINGS: LEFT paramedian midbrain infarct, displays hypoattenuation best seen
on the axial CT. No hemorrhage, mass lesion, or extra-axial fluid.
Moderate atrophy. Prominent ventricles related to central brain
substance loss. Hypoattenuation in the white matter, likely chronic
microvascular ischemic change.

Large midline frontal scalp hematoma. No underlying skull fracture.
No sinus air-fluid level. Negative mastoids. No orbital hematoma.
Dense lenticular opacities.
IMPRESSION: Recent brainstem infarct displays LEFT paramedian hypoattenuation in
the midbrain. No complicating hemorrhage.

No skull fracture or posttraumatic intracranial hemorrhage. Atrophy
with chronic microvascular ischemic change.
